# Patient Record
Sex: Male | Born: 1960 | Race: White | Hispanic: No | Marital: Married | Smoking: Former smoker
Health system: Southern US, Community
[De-identification: ages and names within clinical notes are randomized; demographics above are authoritative.]

## PROBLEM LIST (undated history)

## (undated) DIAGNOSIS — E785 Hyperlipidemia, unspecified: Secondary | ICD-10-CM

## (undated) DIAGNOSIS — H35372 Puckering of macula, left eye: Secondary | ICD-10-CM

## (undated) DIAGNOSIS — R569 Unspecified convulsions: Secondary | ICD-10-CM

## (undated) HISTORY — DX: Hyperlipidemia, unspecified: E78.5

## (undated) HISTORY — DX: Puckering of macula, left eye: H35.372

---
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
## 2008-12-07 HISTORY — PX: VASECTOMY: SHX75

## 2010-08-04 ENCOUNTER — Encounter: Payer: Self-pay | Admitting: Family Medicine

## 2010-08-04 ENCOUNTER — Ambulatory Visit: Payer: Self-pay | Admitting: Internal Medicine

## 2010-08-04 DIAGNOSIS — E663 Overweight: Secondary | ICD-10-CM

## 2010-08-04 LAB — CONVERTED CEMR LAB
CO2: 26 meq/L (ref 19–32)
Chloride: 105 meq/L (ref 96–112)
Glucose, Bld: 81 mg/dL (ref 70–99)
Potassium: 4.6 meq/L (ref 3.5–5.1)
Sodium: 141 meq/L (ref 135–145)

## 2011-01-06 NOTE — Letter (Signed)
Summary: Lipid Letter  [place] [facility]  [address] [place], [place] [address]   Phone: [phone]  Fax: [phone]    08/04/2010  Kyle Velasquez [address] [place], [place]  [address]  Dear Kyle Velasquez:  We have carefully reviewed your last lipid profile from  and the results are noted below with a summary of recommendations for lipid management.    Cholesterol:       213     Goal: < 200   HDL "good" Cholesterol:   57.84     Goal: > 40   LDL "bad" Cholesterol:   135     Goal: < 130   Triglycerides:       230.0     Goal: < 150    Your good cholesterol was a bit low.   Your bad cholesterol was a bit high.  your triglycerides were also elevated more than I'd like them to be.  For this, try to stay away from fatty greasy foods, increase fiber in diet, and increase exercise regimen.  All borderline.  I wouldn't recommend starting medication, but try some overall healthy lifestyle changes.    TLC Diet (Therapeutic Lifestyle Change): Saturated Fats & Transfatty acids should be kept < 7% of total calories ***Reduce Saturated Fats Polyunstaurated Fat can be up to 10% of total calories Monounsaturated Fat Fat can be up to 20% of total calories Total Fat should be no greater than 25-35% of total calories Carbohydrates should be 50-60% of total calories Protein should be approximately 15% of total calories Fiber should be at least 20-30 grams a day ***Increased fiber may help lower LDL Total Cholesterol should be < 200mg /day Consider adding plant stanol/sterols to diet (example: Benacol spread) ***A higher intake of unsaturated fat may reduce Triglycerides and Increase HDL    Adjunctive Measures (may lower LIPIDS and reduce risk of Heart Attack) include: Aerobic Exercise (20-30 minutes 3-4 times a week) Limit Alcohol Consumption Weight Reduction Aspirin 75-81 mg a day by mouth (if not allergic or contraindicated) Dietary Fiber 20-30 grams a day by mouth     Current  Medications: 1)    Claritin-d 24 Hour 10-240 Mg Xr24h-tab (Loratadine-pseudoephedrine) .... As needed allergies  If you have any questions, please call. We appreciate being able to work with you.   Sincerely,    [place] [facility] Eustaquio Boyden  MD  Appended Document: Lipid Letter Letter mailed to patient

## 2011-01-08 NOTE — Assessment & Plan Note (Signed)
Summary: NEW PT TO ESTABH/DLO   Vital Signs:  Patient profile:   50 year old male Height:      66.5 inches Weight:      180.25 pounds BMI:     28.76 Temp:     98.1 degrees F oral Pulse rate:   64 / minute Pulse rhythm:   regular BP sitting:   122 / 84  (left arm) Cuff size:   regular  Vitals Entered By: Selena Batten Dance CMA Duncan Dull) (August 04, 2010 9:16 AM) CC: New patient to establish care   History of Present Illness: CC: new patient.  no concerns, would like physical today.   started basketball 2 wks ago, minor knee and shoulder aches/pains.  Preventive Screening-Counseling & Management  Alcohol-Tobacco     Alcohol drinks/day: <1     Smoking Status: quit > 6 months  Caffeine-Diet-Exercise     Caffeine use/day: 6-8 cups coffee      Drug Use:  never.        Blood Transfusions:  no.    Current Medications (verified): 1)  None  Allergies (verified): No Known Drug Allergies  Past History:  Past Medical History: slightly overweight  Past Surgical History: vasectomy 2010 (Dr. Mordecai Rasmussen [facility]))  Family History: Mother - HTN, HLD Father - hypoglycemia MGF - DM MGM - CHF PGF, GM - DM sister - BRCA  No CAD/MI, CVA, CA  Social History: remote smoking, social EtOH, no rec drugs Occupation: Chemical engineer Lives with wife, 2 daughters, 1 dog. Exercise - basketball weekly, walking 2 miles a daySmoking Status:  quit > 6 months Caffeine use/day:  6-8 cups coffee Drug Use:  never Blood Transfusions:  no  Review of Systems  The patient denies anorexia, fever, weight loss, weight gain, vision loss, decreased hearing, hoarseness, chest pain, syncope, dyspnea on exertion, peripheral edema, prolonged cough, headaches, hemoptysis, abdominal pain, melena, hematochezia, severe indigestion/heartburn, hematuria, incontinence, genital sores, muscle weakness, suspicious skin lesions, transient blindness, difficulty walking, depression, unusual weight change, and  testicular masses.         no n/v/d/c.  all else negative.  Physical Exam  General:  Well-developed,well-nourished,in no acute distress; alert,appropriate and cooperative throughout examination Head:  Normocephalic and atraumatic without obvious abnormalities. No apparent alopecia or balding. Eyes:  No corneal or conjunctival inflammation noted. EOMI. Perrla.  Ears:  External ear exam shows no significant lesions or deformities.  Otoscopic examination reveals clear canals, tympanic membranes are intact bilaterally without bulging, retraction, inflammation or discharge. Hearing is grossly normal bilaterally. Nose:  External nasal examination shows no deformity or inflammation. Nasal mucosa are pink and moist without lesions or exudates.  R turbinate swollen Mouth:  Oral mucosa and oropharynx without lesions or exudates.  Teeth in good repair. Neck:  No deformities, masses, or tenderness noted.  no thyromegaly Lungs:  Normal respiratory effort, chest expands symmetrically. Lungs are clear to auscultation, no crackles or wheezes. Heart:  Normal rate and regular rhythm. S1 and S2 normal without gallop, murmur, click, rub or other extra sounds. Abdomen:  Bowel sounds positive,abdomen soft and non-tender without masses, organomegaly or hernias noted. Msk:  No deformity or scoliosis noted of thoracic or lumbar spine.   Pulses:  2+ rad pulses Extremities:  No clubbing, cyanosis, edema, or deformity noted with normal full range of motion of all joints.   Neurologic:  CN grossly intact, station and gait intact Skin:  Intact without suspicious lesions or rashes Psych:  Cognition and judgment appear intact. Alert and  cooperative with normal attention span and concentration. No apparent delusions, illusions, hallucinations   Impression & Recommendations:  Problem # 1:  OVERWEIGHT (ICD-278.02)  basic blood work.  Ht: 66.5 (08/04/2010)   Wt: 180.25 (08/04/2010)   BMI: 28.76  (08/04/2010)  Orders: TLB-Lipid Panel (80061-LIPID) TLB-BMP (Basic Metabolic Panel-BMET) (80048-METABOL)  Problem # 2:  HEALTH MAINTENANCE EXAM (ICD-V70.0)  Reviewed preventive care protocols, scheduled due services, and updated immunizations.  tdap today.  declines flu, will receive at work.  start PSA and colonscreen next year.  Complete Medication List: 1)  Claritin-d 24 Hour 10-240 Mg Xr24h-tab (Loratadine-pseudoephedrine) .... As needed allergies  Other Orders: Tdap => 58yrs IM (42706) Admin 1st Vaccine (23762)  Patient Instructions: 1)  Please return in 1 year for CPE or as needed. 2)  Tdap today. 3)  Blood work today.   4)  Pleasure to meet you today, call clinic with questions.  Prior Medications: Current Allergies (reviewed today): No known allergies    Prevention & Chronic Care Immunizations   Influenza vaccine: Not documented   Influenza vaccine due: 08/07/2010    Tetanus booster: 08/04/2010: Tdap   Tetanus booster due: 08/04/2020    Pneumococcal vaccine: Not documented  Other Screening   Smoking status: quit > 6 months  (08/04/2010)  Lipids   Total Cholesterol: Not documented   LDL: Not documented   LDL Direct: Not documented   HDL: Not documented   Triglycerides: Not documented   Immunizations Administered:  Tetanus Vaccine:    Vaccine Type: Tdap    Site: left deltoid    Mfr: GlaxoSmithKline    Dose: 0.5 ml    Route: IM    Given by: Selena Batten Dance CMA (AAMA)    Exp. Date: 09/25/2012    Lot #: [id number]    VIS given: 10/25/07 version given August 04, 2010.   Appended Document: NEW PT TO ESTABH/DLO Received records from Dr. Candace Gallus, no records available, inactive, not seen since 2005

## 2012-06-22 ENCOUNTER — Ambulatory Visit (INDEPENDENT_AMBULATORY_CARE_PROVIDER_SITE_OTHER): Payer: Managed Care, Other (non HMO) | Admitting: Family Medicine

## 2012-06-22 ENCOUNTER — Encounter: Payer: Self-pay | Admitting: Family Medicine

## 2012-06-22 ENCOUNTER — Encounter: Payer: Self-pay | Admitting: Internal Medicine

## 2012-06-22 VITALS — BP 118/82 | HR 60 | Temp 97.6°F | Ht 64.0 in | Wt 178.5 lb

## 2012-06-22 DIAGNOSIS — Z0000 Encounter for general adult medical examination without abnormal findings: Secondary | ICD-10-CM

## 2012-06-22 DIAGNOSIS — Z125 Encounter for screening for malignant neoplasm of prostate: Secondary | ICD-10-CM

## 2012-06-22 DIAGNOSIS — Z1211 Encounter for screening for malignant neoplasm of colon: Secondary | ICD-10-CM

## 2012-06-22 DIAGNOSIS — E785 Hyperlipidemia, unspecified: Secondary | ICD-10-CM

## 2012-06-22 HISTORY — DX: Hyperlipidemia, unspecified: E78.5

## 2012-06-22 LAB — LIPID PANEL
HDL: 41.8 mg/dL (ref >39.00)
Total CHOL/HDL Ratio: 6
Triglycerides: 189 mg/dL — ABNORMAL HIGH (ref 0.0–149.0)

## 2012-06-22 LAB — COMPREHENSIVE METABOLIC PANEL
ALT: 23 U/L (ref 0–53)
Alkaline Phosphatase: 56 U/L (ref 39–117)
Glucose, Bld: 99 mg/dL (ref 70–99)
Sodium: 139 mEq/L (ref 135–145)
Total Bilirubin: 0.6 mg/dL (ref 0.3–1.2)
Total Protein: 7 g/dL (ref 6.0–8.3)

## 2012-06-22 LAB — TSH: TSH: 1.71 u[IU]/mL (ref 0.35–5.50)

## 2012-06-22 NOTE — Patient Instructions (Signed)
Decrease added sugars, eliminate trans fats, increase fiber and limit alcohol.  All these changes together can drop triglycerides by almost 50%.  Pass by Marion's office up front to schedule colonoscopy around [place]. Return in 1 year for physical. Good to see you today.

## 2012-06-22 NOTE — Assessment & Plan Note (Signed)
Discussed healthy diet 

## 2012-06-22 NOTE — Assessment & Plan Note (Signed)
Body mass index is 30.64 kg/(m^2).  Preventative protocols reviewed and updated unless pt declined Discussed healthy diet, lifestyle

## 2012-06-22 NOTE — Progress Notes (Signed)
Subjective:    Patient ID: Kyle Velasquez, male    DOB: [birth year], 51 y.o.   MRN: [id number]  HPI CC: CPE  No concerns today.  Some stress at work.  Preventative: Colon screening - did notice blood in stool last week, in stool.  Would like colonoscopy Prostate screening - discussed, would like to get screened. Tetanus 2011 (Tdap) Seat belt use discussed 100% Doesn't use sunscreen  Caffeine use/day:  2-3 cups coffee/day Lives with wife, 2 daughters, 1 dog. Occupation: Chemical engineer Exercise - basketball weekly, walking 2 miles a day Diet: good water, fruits/vegetables occasionally  Medications and allergies reviewed and updated in chart.  Past histories reviewed and updated if relevant as below. Patient Active Problem List  Diagnosis  . OVERWEIGHT   No past medical history on file. Past Surgical History  Procedure Date  . Vasectomy 2010    Dr. Mordecai Rasmussen [facility])   History  Substance Use Topics  . Smoking status: Former Smoker    Quit date: 12/07/1986  . Smokeless tobacco: Never Used  . Alcohol Use: Yes     Occasional   Family History  Problem Relation Age of Onset  . Hypertension Mother   . Hyperlipidemia Mother   . Other Father     Hypoglycemia  . Diabetes Maternal Grandfather   . Heart failure Maternal Grandmother   . Diabetes Paternal Grandfather   . Diabetes Paternal Grandmother   . Cancer Sister     breast  . Cancer Maternal Aunt     breast  . Coronary artery disease Neg Hx   . Stroke Neg Hx    No Known Allergies No current outpatient prescriptions on file prior to visit.    Review of Systems  Constitutional: Negative for fever, chills, activity change, appetite change, fatigue and unexpected weight change.  HENT: Negative for hearing loss and neck pain.   Eyes: Negative for visual disturbance.  Respiratory: Negative for cough, chest tightness, shortness of breath and wheezing.   Cardiovascular: Negative for chest pain, palpitations and  leg swelling.  Gastrointestinal: Positive for blood in stool (last week). Negative for nausea, vomiting, abdominal pain, diarrhea, constipation and abdominal distention.  Genitourinary: Negative for hematuria and difficulty urinating.  Musculoskeletal: Negative for myalgias and arthralgias.  Skin: Negative for rash.  Neurological: Negative for dizziness, seizures, syncope and headaches.  Hematological: Does not bruise/bleed easily.  Psychiatric/Behavioral: Negative for dysphoric mood. The patient is not nervous/anxious.        Objective:   Physical Exam  Nursing note and vitals reviewed. Constitutional: He is oriented to person, place, and time. He appears well-developed and well-nourished. No distress.  HENT:  Head: Normocephalic and atraumatic.  Right Ear: External ear normal.  Left Ear: External ear normal.  Nose: Nose normal.  Mouth/Throat: Oropharynx is clear and moist. No oropharyngeal exudate.  Eyes: Conjunctivae and EOM are normal. Pupils are equal, round, and reactive to light. No scleral icterus.  Neck: Normal range of motion. Neck supple. No thyromegaly present.  Cardiovascular: Normal rate, regular rhythm, normal heart sounds and intact distal pulses.   No murmur heard. Pulses:      Radial pulses are 2+ on the right side, and 2+ on the left side.  Pulmonary/Chest: Effort normal and breath sounds normal. No respiratory distress. He has no wheezes. He has no rales.  Abdominal: Soft. Bowel sounds are normal. He exhibits no distension and no mass. There is no tenderness. There is no rebound and no guarding.  Genitourinary: Prostate normal.  Rectal exam shows external hemorrhoid (right sided, swollen, nontender, noniflammed). Rectal exam shows no internal hemorrhoid, no fissure, no mass, no tenderness and anal tone normal. Prostate is not enlarged and not tender.  Musculoskeletal: Normal range of motion. He exhibits no edema.  Lymphadenopathy:    He has no cervical adenopathy.    Neurological: He is alert and oriented to person, place, and time.       CN grossly intact, station and gait intact  Skin: Skin is warm and dry. No rash noted.  Psychiatric: He has a normal mood and affect. His behavior is normal. Judgment and thought content normal.       Assessment & Plan:

## 2012-08-07 HISTORY — PX: COLONOSCOPY: SHX174

## 2012-08-12 ENCOUNTER — Ambulatory Visit (AMBULATORY_SURGERY_CENTER): Payer: Managed Care, Other (non HMO) | Admitting: *Deleted

## 2012-08-12 VITALS — Ht 64.0 in | Wt 180.0 lb

## 2012-08-12 DIAGNOSIS — Z1211 Encounter for screening for malignant neoplasm of colon: Secondary | ICD-10-CM

## 2012-08-12 MED ORDER — NA SULFATE-K SULFATE-MG SULF 17.5-3.13-1.6 GM/177ML PO SOLN: 1.0000 | Freq: Once | ORAL | Status: DC

## 2012-08-26 ENCOUNTER — Encounter: Payer: Self-pay | Admitting: Internal Medicine

## 2012-08-26 ENCOUNTER — Ambulatory Visit (AMBULATORY_SURGERY_CENTER): Payer: Managed Care, Other (non HMO) | Admitting: Internal Medicine

## 2012-08-26 VITALS — BP 94/46 | HR 80 | Temp 97.3°F | Resp 15 | Ht 64.0 in | Wt 180.0 lb

## 2012-08-26 DIAGNOSIS — Z1211 Encounter for screening for malignant neoplasm of colon: Secondary | ICD-10-CM

## 2012-08-26 MED ORDER — SODIUM CHLORIDE 0.9 % IV SOLN: 500.0000 mL | INTRAVENOUS | Status: DC

## 2012-08-26 NOTE — Op Note (Signed)
[facility] [address] [place], [address]   COLONOSCOPY PROCEDURE REPORT  PATIENT: Kyle Velasquez, Kyle Velasquez  MR#: [id number] BIRTHDATE: Apr 06, 1961 , 51  yrs. old GENDER: Male ENDOSCOPIST: Beverley Fiedler, MD REFERRED [id number], Wynona Canes PROCEDURE DATE:  08/26/2012 PROCEDURE:   Colonoscopy, screening ASA CLASS:   Class I INDICATIONS:average risk screening. MEDICATIONS: MAC sedation, administered by CRNA and Propofol (Diprivan) 260 mg IV  DESCRIPTION OF PROCEDURE:   After the risks benefits and alternatives of the procedure were thoroughly explained, informed consent was obtained.  A digital rectal exam revealed several skin tags.   The LB [id number]  endoscope was introduced through the anus and advanced to the cecum, which was identified by both the appendix and ileocecal valve. No adverse events experienced. The quality of the prep was Suprep good  The instrument was then slowly withdrawn as the colon was fully examined.      COLON FINDINGS: A normal appearing cecum, ileocecal valve, and appendiceal orifice were identified.  The ascending, hepatic flexure, transverse, splenic flexure, descending, sigmoid colon and rectum appeared unremarkable.  No polyps or cancers were seen. Small internal hemorrhoids were found.  Retroflexed views revealed internal hemorrhoids. The time to cecum=6 minutes 51 seconds. Withdrawal time=9 minutes 30 seconds.  The scope was withdrawn and the procedure completed.  COMPLICATIONS: There were no complications.  ENDOSCOPIC IMPRESSION: 1.   Normal colon 2.   Small internal hemorrhoids  RECOMMENDATIONS: You should continue to follow colorectal cancer screening guidelines for "routine risk" patients with a repeat colonoscopy in 10 years. There is no need for FOBT (stool) testing for at least 5 years.   eSigned:  Beverley Fiedler, MD 08/26/2012 8:54 AM   cc: Eustaquio Boyden MD and The Patient

## 2012-08-26 NOTE — Patient Instructions (Addendum)

## 2012-08-26 NOTE — Progress Notes (Addendum)
Patient did not have preoperative order for IV antibiotic SSI prophylaxis. (G8918)  Patient did not experience any of the following events: a burn prior to discharge; a fall within the facility; wrong site/side/patient/procedure/implant event; or a hospital transfer or hospital admission upon discharge from the facility. (G8907)  

## 2012-08-29 ENCOUNTER — Telehealth: Payer: Self-pay

## 2012-08-29 NOTE — Telephone Encounter (Signed)
  Follow up Call-  Call back number 08/26/2012  Post procedure Call Back phone  # [phone]  Permission to leave phone message Yes     Patient questions:  Do you have a fever, pain , or abdominal swelling? no Pain Score  0 *  Have you tolerated food without any problems? yes  Have you been able to return to your normal activities? yes  Do you have any questions about your discharge instructions: Diet   no Medications  no Follow up visit  no  Do you have questions or concerns about your Care? no  Actions: * If pain score is 4 or above: No action needed, pain <4.

## 2012-08-31 ENCOUNTER — Encounter: Payer: Self-pay | Admitting: Family Medicine

## 2013-09-13 ENCOUNTER — Encounter: Payer: Self-pay | Admitting: Family Medicine

## 2013-09-13 ENCOUNTER — Ambulatory Visit (INDEPENDENT_AMBULATORY_CARE_PROVIDER_SITE_OTHER): Payer: Managed Care, Other (non HMO) | Admitting: Family Medicine

## 2013-09-13 VITALS — BP 114/88 | HR 60 | Temp 98.0°F | Ht 65.32 in | Wt 184.0 lb

## 2013-09-13 DIAGNOSIS — E663 Overweight: Secondary | ICD-10-CM

## 2013-09-13 DIAGNOSIS — Z125 Encounter for screening for malignant neoplasm of prostate: Secondary | ICD-10-CM

## 2013-09-13 DIAGNOSIS — E785 Hyperlipidemia, unspecified: Secondary | ICD-10-CM

## 2013-09-13 DIAGNOSIS — K644 Residual hemorrhoidal skin tags: Secondary | ICD-10-CM

## 2013-09-13 DIAGNOSIS — K649 Unspecified hemorrhoids: Secondary | ICD-10-CM | POA: Insufficient documentation

## 2013-09-13 DIAGNOSIS — Z0000 Encounter for general adult medical examination without abnormal findings: Secondary | ICD-10-CM

## 2013-09-13 LAB — LIPID PANEL
HDL: 44.2 mg/dL (ref >39.00)
Total CHOL/HDL Ratio: 6
Triglycerides: 245 mg/dL — ABNORMAL HIGH (ref 0.0–149.0)

## 2013-09-13 LAB — BASIC METABOLIC PANEL
Calcium: 9.3 mg/dL (ref 8.4–10.5)
GFR: 64.43 mL/min (ref >60.00)
Potassium: 4.6 mEq/L (ref 3.5–5.1)
Sodium: 142 mEq/L (ref 135–145)

## 2013-09-13 NOTE — Assessment & Plan Note (Signed)
Body mass index is 30.32 kg/(m^2). Encouraged continued walking.

## 2013-09-13 NOTE — Assessment & Plan Note (Signed)
Not bothersome.  Declines further intervention currently.

## 2013-09-13 NOTE — Patient Instructions (Signed)
Blood work today. Continue walking.  let's watch weight. Return in 1 year for next physical.

## 2013-09-13 NOTE — Assessment & Plan Note (Signed)
Check FLP today. Discussed healthy diet to improve lipids.

## 2013-09-13 NOTE — Progress Notes (Signed)
Subjective:    Patient ID: Kyle Velasquez, male    DOB: [birth year], 52 y.o.   MRN: [id number]  HPI CC: CPE  claritin d use intermittently. Recent vision exam stable.  No changes recently. Wt Readings from Last 3 Encounters:  09/13/13 184 lb (83.462 kg)  08/26/12 180 lb (81.647 kg)  08/12/12 180 lb (81.647 kg)  Body mass index is 30.32 kg/(m^2).   Preventative:  Colon screening - done by Dr. Rhea Belton 2013.  WNL Prostate screening - discussed, would like to get screened.  Tetanus 2011 To get flu shot at work next week  Seat belt use discussed 100%  Doesn't use sunscreen.  No sunburns or suspicious moles.  Caffeine use/day: 2-3 cups coffee/day  Lives with wife, 2 daughters, 1 dog.  Occupation: Chemical engineer  Exercise - basketball weekly, walking 2 miles a day  Diet: some water, fruits/vegetables occasionally, red meat 1x/wk, fish rarely  Medications and allergies reviewed and updated in chart.  Past histories reviewed and updated if relevant as below. Patient Active Problem List   Diagnosis Date Noted  . Healthcare maintenance 06/22/2012  . Dyslipidemia 06/22/2012  . OVERWEIGHT 08/04/2010   Past Medical History  Diagnosis Date  . Dyslipidemia 06/22/2012   Past Surgical History  Procedure Laterality Date  . Vasectomy  2010    Dr. Mordecai Rasmussen [facility])  . Colonoscopy  08/2012    WNL, sm int hemorrhoids, rec rpt 10 yrs (Pyrtle)   History  Substance Use Topics  . Smoking status: Former Smoker    Quit date: 12/07/1986  . Smokeless tobacco: Never Used  . Alcohol Use: Yes     Comment: Occasional   Family History  Problem Relation Age of Onset  . Hypertension Mother   . Hyperlipidemia Mother   . Other Father     Hypoglycemia  . Diabetes Maternal Grandfather   . Heart failure Maternal Grandmother   . Diabetes Paternal Grandfather   . Diabetes Paternal Grandmother   . Cancer Sister     breast  . Cancer Maternal Aunt     breast  . Coronary artery disease Neg  Hx   . Stroke Neg Hx   . Colon cancer Neg Hx   . Stomach cancer Neg Hx    No Known Allergies No current outpatient prescriptions on file prior to visit.   No current facility-administered medications on file prior to visit.     Review of Systems  Constitutional: Negative for fever, chills, activity change, appetite change, fatigue and unexpected weight change.  HENT: Negative for hearing loss.   Eyes: Negative for visual disturbance.  Respiratory: Negative for cough, chest tightness, shortness of breath and wheezing.   Cardiovascular: Negative for chest pain, palpitations and leg swelling.  Gastrointestinal: Negative for nausea, vomiting, abdominal pain, diarrhea, constipation, blood in stool and abdominal distention.  Genitourinary: Negative for hematuria and difficulty urinating.  Musculoskeletal: Negative for arthralgias, myalgias and neck pain.  Skin: Negative for rash.  Neurological: Negative for dizziness, seizures, syncope and headaches.  Hematological: Negative for adenopathy. Does not bruise/bleed easily.  Psychiatric/Behavioral: Negative for dysphoric mood. The patient is not nervous/anxious.        Objective:   Physical Exam  Nursing note and vitals reviewed. Constitutional: He is oriented to person, place, and time. He appears well-developed and well-nourished. No distress.  HENT:  Head: Normocephalic and atraumatic.  Right Ear: Hearing, tympanic membrane, external ear and ear canal normal.  Left Ear: Hearing, tympanic membrane, external ear and ear canal  normal.  Nose: Nose normal.  Mouth/Throat: Oropharynx is clear and moist. No oropharyngeal exudate.  Eyes: Conjunctivae and EOM are normal. Pupils are equal, round, and reactive to light. No scleral icterus.  Neck: Normal range of motion. Neck supple. No thyromegaly present.  Cardiovascular: Normal rate, regular rhythm, normal heart sounds and intact distal pulses.   No murmur heard. Pulses:      Radial pulses  are 2+ on the right side, and 2+ on the left side.  Pulmonary/Chest: Effort normal and breath sounds normal. No respiratory distress. He has no wheezes. He has no rales.  Abdominal: Soft. Bowel sounds are normal. He exhibits no distension and no mass. There is no tenderness. There is no rebound and no guarding.  Genitourinary: Prostate normal. Rectal exam shows external hemorrhoid (posterior and right sided present). Rectal exam shows no internal hemorrhoid, no fissure, no mass, no tenderness and anal tone normal. Prostate is not enlarged and not tender.  Musculoskeletal: Normal range of motion. He exhibits no edema.  Lymphadenopathy:    He has no cervical adenopathy.  Neurological: He is alert and oriented to person, place, and time.  CN grossly intact, station and gait intact  Skin: Skin is warm and dry. No rash noted.  Psychiatric: He has a normal mood and affect. His behavior is normal. Judgment and thought content normal.       Assessment & Plan:

## 2013-09-13 NOTE — Assessment & Plan Note (Addendum)
Preventative protocols reviewed and updated unless pt declined. Discussed healthy diet and lifestyle. Flu shot at work. 

## 2013-09-14 LAB — LDL CHOLESTEROL, DIRECT: Direct LDL: 178.6 mg/dL

## 2013-09-15 ENCOUNTER — Encounter: Payer: Self-pay | Admitting: *Deleted

## 2014-03-30 ENCOUNTER — Encounter: Payer: Self-pay | Admitting: Internal Medicine

## 2014-03-30 ENCOUNTER — Ambulatory Visit (INDEPENDENT_AMBULATORY_CARE_PROVIDER_SITE_OTHER): Payer: Managed Care, Other (non HMO) | Admitting: Internal Medicine

## 2014-03-30 VITALS — BP 110/70 | HR 80 | Temp 98.0°F | Wt 185.2 lb

## 2014-03-30 DIAGNOSIS — J309 Allergic rhinitis, unspecified: Secondary | ICD-10-CM

## 2014-03-30 NOTE — Patient Instructions (Addendum)

## 2014-03-30 NOTE — Progress Notes (Signed)
Pre visit review using our clinic review tool, if applicable. No additional management support is needed unless otherwise documented below in the visit note. 

## 2014-03-30 NOTE — Progress Notes (Signed)
HPI  Pt presents to the clinic today with c/o cough and chest congestions. He reports this started 2 days ago. The cough is non productive. He denies fever, chills or body aches. He has tried Claritin D and Mucinex OTC. He has no history of allergies or breathing problems. He has had sick contacts.  Review of Systems      Past Medical History  Diagnosis Date  . Dyslipidemia 06/22/2012    Family History  Problem Relation Age of Onset  . Hypertension Mother   . Hyperlipidemia Mother   . Other Father     Hypoglycemia  . Diabetes Maternal Grandfather   . Heart failure Maternal Grandmother   . Diabetes Paternal Grandfather   . Diabetes Paternal Grandmother   . Cancer Sister     breast  . Cancer Maternal Aunt     breast  . Coronary artery disease Neg Hx   . Stroke Neg Hx   . Colon cancer Neg Hx   . Stomach cancer Neg Hx     History   Social History  . Marital Status: Married    Spouse Name: N/A    Number of Children: 2  . Years of Education: N/A   Occupational History  . Operations manager    Social History Main Topics  . Smoking status: Former Smoker    Quit date: 12/07/1986  . Smokeless tobacco: Never Used  . Alcohol Use: Yes     Comment: Occasional  . Drug Use: No  . Sexual Activity: Not on file   Other Topics Concern  . Not on file   Social History Narrative   Caffeine use/day: 2-3 cups coffee/day    Lives with wife, 2 daughters, 1 dog.    Occupation: Network engineer    Exercise - basketball weekly, walking 2 miles a day    Diet: some water, fruits/vegetables occasionally, red meat 1x/wk, fish rarely    No Known Allergies   Constitutional: Denies headache, fatigue, fever or abrupt weight changes.  HEENT:   Denies eye redness, eye pain, pressure behind the eyes, facial pain, nasal congestion, ear pain, ringing in the ears, wax buildup, runny nose or bloody nose. Respiratory: Positive cough. Denies difficulty breathing or shortness of breath.   Cardiovascular: Denies chest pain, chest tightness, palpitations or swelling in the hands or feet.   No other specific complaints in a complete review of systems (except as listed in HPI above).  Objective:   Wt 185 lb 4 oz (84.029 kg) Wt Readings from Last 3 Encounters:  03/30/14 185 lb 4 oz (84.029 kg)  09/13/13 184 lb (83.462 kg)  08/26/12 180 lb (81.647 kg)     General: Appears his stated age, well developed, well nourished in NAD. HEENT: Head: normal shape and size; Eyes: sclera white, no icterus, conjunctiva pink, PERRLA and EOMs intact; Ears: Tm's gray and intact, normal light reflex; Nose: mucosa pink and moist, septum midline; Throat/Mouth: + PND. Teeth present, mucosa erythematous and moist, no exudate noted, no lesions or ulcerations noted.  Neck: Neck supple, trachea midline. No massses, lumps or thyromegaly present.  Cardiovascular: Normal rate and rhythm. S1,S2 noted.  No murmur, rubs or gallops noted. No JVD or BLE edema. No carotid bruits noted. Pulmonary/Chest: Normal effort and positive vesicular breath sounds. No respiratory distress. No wheezes, rales or ronchi noted.      Assessment & Plan:   Allergic Rhinitis:  Get some rest and drink plenty of water Do salt water gargles for the  sore throat Delsym OTC for cough Continue mucinex and Claritin D  RTC as needed or if symptoms persist.

## 2015-01-23 ENCOUNTER — Ambulatory Visit (INDEPENDENT_AMBULATORY_CARE_PROVIDER_SITE_OTHER): Payer: Managed Care, Other (non HMO) | Admitting: Family Medicine

## 2015-01-23 ENCOUNTER — Encounter: Payer: Self-pay | Admitting: Family Medicine

## 2015-01-23 ENCOUNTER — Encounter: Payer: Self-pay | Admitting: *Deleted

## 2015-01-23 ENCOUNTER — Other Ambulatory Visit: Payer: Self-pay | Admitting: Family Medicine

## 2015-01-23 VITALS — BP 116/84 | HR 68 | Temp 97.9°F | Ht 66.5 in | Wt 197.5 lb

## 2015-01-23 DIAGNOSIS — Z125 Encounter for screening for malignant neoplasm of prostate: Secondary | ICD-10-CM

## 2015-01-23 DIAGNOSIS — K649 Unspecified hemorrhoids: Secondary | ICD-10-CM

## 2015-01-23 DIAGNOSIS — Z0000 Encounter for general adult medical examination without abnormal findings: Secondary | ICD-10-CM

## 2015-01-23 DIAGNOSIS — E669 Obesity, unspecified: Secondary | ICD-10-CM

## 2015-01-23 DIAGNOSIS — E785 Hyperlipidemia, unspecified: Secondary | ICD-10-CM

## 2015-01-23 LAB — COMPREHENSIVE METABOLIC PANEL
ALBUMIN: 4.3 g/dL (ref 3.5–5.2)
ALK PHOS: 74 U/L (ref 39–117)
ALT: 48 U/L (ref 0–53)
AST: 23 U/L (ref 0–37)
BUN: 27 mg/dL — AB (ref 6–23)
CALCIUM: 9.6 mg/dL (ref 8.4–10.5)
CHLORIDE: 106 meq/L (ref 96–112)
CO2: 25 mEq/L (ref 19–32)
Creatinine, Ser: 1.21 mg/dL (ref 0.40–1.50)
GFR: 66.55 mL/min (ref >60.00)
GLUCOSE: 89 mg/dL (ref 70–99)
Potassium: 4.4 mEq/L (ref 3.5–5.1)
Sodium: 139 mEq/L (ref 135–145)
TOTAL PROTEIN: 7.2 g/dL (ref 6.0–8.3)
Total Bilirubin: 0.4 mg/dL (ref 0.2–1.2)

## 2015-01-23 LAB — PSA: PSA: 0.57 ng/mL (ref 0.10–4.00)

## 2015-01-23 LAB — LIPID PANEL
CHOLESTEROL: 296 mg/dL — AB (ref 0–200)
HDL: 34.2 mg/dL — ABNORMAL LOW (ref >39.00)
TRIGLYCERIDES: 531 mg/dL — AB (ref 0.0–149.0)
Total CHOL/HDL Ratio: 9

## 2015-01-23 LAB — LDL CHOLESTEROL, DIRECT: LDL DIRECT: 144 mg/dL

## 2015-01-23 NOTE — Assessment & Plan Note (Addendum)
Persistent, enlarging. Interested in further treatment - will refer to gen surg clinic.

## 2015-01-23 NOTE — Assessment & Plan Note (Signed)
Check FLP today, discussed low chol diet.  Recommend mediterranean diet, educational handout provided

## 2015-01-23 NOTE — Progress Notes (Signed)
BP 116/84 mmHg  Pulse 68  Temp(Src) 97.9 F (36.6 C) (Oral)  Ht 5' 6.5" (1.689 m)  Wt 197 lb 8 oz (89.585 kg)  BMI 31.40 kg/m2   CC: CPE  Subjective:    Patient ID: Kyle Velasquez, male    DOB: 1961-07-30, 54 y.o.   MRN: [id number]  HPI: Kyle Velasquez is a 54 y.o. male presenting on 01/23/2015 for Annual Exam   Chronic hemorrhoid present for several years - no pain or bleeding. asxs but wants definitive treatment for this. Enlarging.   Preventative: Colon screening - done by Dr. Hilarie Fredrickson 2013. WNL Prostate screening - would like yearly screen Tetanus 2011 Flu shot - 10/2014 Seat belt use discussed 100% No sunburns or suspicious moles.  Caffeine use/day: 2-3 cups coffee/day  Lives with wife, 2 daughters, 1 dog.  Occupation: Network engineer  Exercise - basketball weekly, stopped walking 2 miles a day Diet: some water, fruits/vegetables occasionally, red meat 1x/wk, fish rarely  Relevant past medical, surgical, family and social history reviewed and updated as indicated. Interim medical history since our last visit reviewed. Allergies and medications reviewed and updated. No current outpatient prescriptions on file prior to visit.   No current facility-administered medications on file prior to visit.    Review of Systems  Constitutional: Negative for fever, chills, activity change, appetite change, fatigue and unexpected weight change.  HENT: Negative for hearing loss.   Eyes: Negative for visual disturbance.  Respiratory: Negative for cough, chest tightness, shortness of breath and wheezing.   Cardiovascular: Positive for chest pain (sharp intermittently). Negative for palpitations and leg swelling.  Gastrointestinal: Negative for nausea, vomiting, abdominal pain, diarrhea, constipation, blood in stool and abdominal distention.  Genitourinary: Negative for hematuria and difficulty urinating.  Musculoskeletal: Negative for myalgias, arthralgias and neck  pain.  Skin: Negative for rash.  Neurological: Negative for dizziness, seizures, syncope and headaches.  Hematological: Negative for adenopathy. Does not bruise/bleed easily.  Psychiatric/Behavioral: Negative for dysphoric mood. The patient is not nervous/anxious.    Per HPI unless specifically indicated above     Objective:    BP 116/84 mmHg  Pulse 68  Temp(Src) 97.9 F (36.6 C) (Oral)  Ht 5' 6.5" (1.689 m)  Wt 197 lb 8 oz (89.585 kg)  BMI 31.40 kg/m2  Wt Readings from Last 3 Encounters:  01/23/15 197 lb 8 oz (89.585 kg)  03/30/14 185 lb 4 oz (84.029 kg)  09/13/13 184 lb (83.462 kg)    Physical Exam  Constitutional: He is oriented to person, place, and time. He appears well-developed and well-nourished. No distress.  HENT:  Head: Normocephalic and atraumatic.  Right Ear: Hearing, tympanic membrane, external ear and ear canal normal.  Left Ear: Hearing, tympanic membrane, external ear and ear canal normal.  Nose: Nose normal.  Mouth/Throat: Uvula is midline, oropharynx is clear and moist and mucous membranes are normal. No oropharyngeal exudate, posterior oropharyngeal edema or posterior oropharyngeal erythema.  Eyes: Conjunctivae and EOM are normal. Pupils are equal, round, and reactive to light. No scleral icterus.  Neck: Normal range of motion. Neck supple. No thyromegaly present.  Cardiovascular: Normal rate, regular rhythm, normal heart sounds and intact distal pulses.   No murmur heard. Pulses:      Radial pulses are 2+ on the right side, and 2+ on the left side.  Pulmonary/Chest: Effort normal and breath sounds normal. No respiratory distress. He has no wheezes. He has no rales.  Abdominal: Soft. Bowel sounds are normal. He exhibits no  distension and no mass. There is no tenderness. There is no rebound and no guarding.  Genitourinary: Prostate normal. Rectal exam shows external hemorrhoid (2 separate growths R anus). Rectal exam shows no internal hemorrhoid, no fissure,  no mass, no tenderness and anal tone normal. Prostate is not enlarged (15gm) and not tender.  Musculoskeletal: Normal range of motion. He exhibits no edema.  Lymphadenopathy:    He has no cervical adenopathy.  Neurological: He is alert and oriented to person, place, and time.  CN grossly intact, station and gait intact  Skin: Skin is warm and dry. No rash noted.  Psychiatric: He has a normal mood and affect. His behavior is normal. Judgment and thought content normal.  Nursing note and vitals reviewed.  Results for orders placed or performed in visit on 09/13/13  Lipid panel  Result Value Ref Range   Cholesterol 274 (H) 0 - 200 mg/dL   Triglycerides 245.0 (H) 0.0 - 149.0 mg/dL   HDL 44.20 >39.00 mg/dL   VLDL 49.0 (H) 0.0 - 40.0 mg/dL   Total CHOL/HDL Ratio 6   Basic metabolic panel  Result Value Ref Range   Sodium 142 135 - 145 mEq/L   Potassium 4.6 3.5 - 5.1 mEq/L   Chloride 108 96 - 112 mEq/L   CO2 24 19 - 32 mEq/L   Glucose, Bld 100 (H) 70 - 99 mg/dL   BUN 25 (H) 6 - 23 mg/dL   Creatinine, Ser 1.3 0.4 - 1.5 mg/dL   Calcium 9.3 8.4 - 10.5 mg/dL   GFR 64.43 >60.00 mL/min  PSA  Result Value Ref Range   PSA 0.45 0.10 - 4.00 ng/mL  LDL cholesterol, direct  Result Value Ref Range   Direct LDL 178.6 mg/dL      Assessment & Plan:   Problem List Items Addressed This Visit    Obese    Body mass index is 31.4 kg/(m^2). Discussed healthy diet and lifestyle changes to affect sustainable weight loss.      Hemorrhoid    Persistent, enlarging. Interested in further treatment - will refer to gen surg clinic.      Healthcare maintenance - Primary    Preventative protocols reviewed and updated unless pt declined. Discussed healthy diet and lifestyle.       Dyslipidemia    Check FLP today, discussed low chol diet.  Recommend mediterranean diet, educational handout provided      Relevant Orders   Lipid panel   Comprehensive metabolic panel    Other Visit Diagnoses     Special screening for malignant neoplasm of prostate        Relevant Orders    PSA        Follow up plan: Return in about 1 year (around 01/24/2016), or as needed, for physical.

## 2015-01-23 NOTE — Patient Instructions (Addendum)
Good to see you today, call us with questions. We will call you for referral to surgery to discuss options.  Blood work today. Return as needed or in 1 year for next physical.      Mediterranean Diet  Why follow it? Research shows. . Those who follow the Mediterranean diet have a reduced risk of heart disease  . The diet is associated with a reduced incidence of Parkinson's and Alzheimer's diseases . People following the diet may have longer life expectancies and lower rates of chronic diseases  . The Dietary Guidelines for Americans recommends the Mediterranean diet as an eating plan to promote health and prevent disease  What Is the Mediterranean Diet?  . Healthy eating plan based on typical foods and recipes of Mediterranean-style cooking . The diet is primarily a plant based diet; these foods should make up a majority of meals   Starches - Plant based foods should make up a majority of meals - They are an important sources of vitamins, minerals, energy, antioxidants, and fiber - Choose whole grains, foods high in fiber and minimally processed items  - Typical grain sources include wheat, oats, barley, corn, brown rice, bulgar, farro, millet, polenta, couscous  - Various types of beans include chickpeas, lentils, fava beans, black beans, white beans   Fruits  Veggies - Large quantities of antioxidant rich fruits & veggies; 6 or more servings  - Vegetables can be eaten raw or lightly drizzled with oil and cooked  - Vegetables common to the traditional Mediterranean Diet include: artichokes, arugula, beets, broccoli, brussel sprouts, cabbage, carrots, celery, collard greens, cucumbers, eggplant, kale, leeks, lemons, lettuce, mushrooms, okra, onions, peas, peppers, potatoes, pumpkin, radishes, rutabaga, shallots, spinach, sweet potatoes, turnips, zucchini - Fruits common to the Mediterranean Diet include: apples, apricots, avocados, cherries, clementines, dates, figs, grapefruits, grapes,  melons, nectarines, oranges, peaches, pears, pomegranates, strawberries, tangerines  Fats - Replace butter and margarine with healthy oils, such as olive oil, canola oil, and tahini  - Limit nuts to no more than a handful a day  - Nuts include walnuts, almonds, pecans, pistachios, pine nuts  - Limit or avoid candied, honey roasted or heavily salted nuts - Olives are central to the [employer] - can be eaten whole or used in a variety of dishes   Meats Protein - Limiting red meat: no more than a few times a month - When eating red meat: choose lean cuts and keep the portion to the size of deck of cards - Eggs: approx. 0 to 4 times a week  - Fish and lean poultry: at least 2 a week  - Healthy protein sources include, chicken, [place], lean beef, lamb - Increase intake of seafood such as tuna, salmon, trout, mackerel, shrimp, scallops - Avoid or limit high fat processed meats such as sausage and bacon  Dairy - Include moderate amounts of low fat dairy products  - Focus on healthy dairy such as fat free yogurt, skim milk, low or reduced fat cheese - Limit dairy products higher in fat such as whole or 2% milk, cheese, ice cream  Alcohol - Moderate amounts of red wine is ok  - No more than 5 oz daily for women (all ages) and men older than age 61  - No more than 10 oz of wine daily for men younger than 39  Other - Limit sweets and other desserts  - Use herbs and spices instead of salt to flavor foods  - Herbs and spices common  to the traditional Mediterranean Diet include: basil, bay leaves, chives, cloves, cumin, fennel, garlic, lavender, marjoram, mint, oregano, parsley, pepper, rosemary, sage, savory, sumac, tarragon, thyme   It's not just a diet, it's a lifestyle:  . The Mediterranean diet includes lifestyle factors typical of those in the region  . Foods, drinks and meals are best eaten with others and savored . Daily physical activity is important for overall good health . This could  be strenuous exercise like running and aerobics . This could also be more leisurely activities such as walking, housework, yard-work, or taking the stairs . Moderation is the key; a balanced and healthy diet accommodates most foods and drinks . Consider portion sizes and frequency of consumption of certain foods   Meal Ideas & Options:  . Breakfast:  o Whole wheat toast or whole wheat English muffins with peanut butter & hard boiled egg o Steel cut oats topped with apples & cinnamon and skim milk  o Fresh fruit: banana, strawberries, melon, berries, peaches  o Smoothies: strawberries, bananas, greek yogurt, peanut butter o Low fat greek yogurt with blueberries and granola  o Egg white omelet with spinach and mushrooms o Breakfast couscous: whole wheat couscous, apricots, skim milk, cranberries  . Sandwiches:  o Hummus and grilled vegetables (peppers, zucchini, squash) on whole wheat bread   o Grilled chicken on whole wheat pita with lettuce, tomatoes, cucumbers or tzatziki  o Tuna salad on whole wheat bread: tuna salad made with greek yogurt, olives, red peppers, capers, green onions o Garlic rosemary lamb pita: lamb sauted with garlic, rosemary, salt & pepper; add lettuce, cucumber, greek yogurt to pita - flavor with lemon juice and black pepper  . Seafood:  o Mediterranean grilled salmon, seasoned with garlic, basil, parsley, lemon juice and black pepper o Shrimp, lemon, and spinach whole-grain pasta salad made with low fat greek yogurt  o Seared scallops with lemon orzo  o Seared tuna steaks seasoned salt, pepper, coriander topped with tomato mixture of olives, tomatoes, olive oil, minced garlic, parsley, green onions and cappers  . Meats:  o Herbed greek chicken salad with kalamata olives, cucumber, feta  o Red bell peppers stuffed with spinach, bulgur, lean ground beef (or lentils) & topped with feta   o Kebabs: skewers of chicken, tomatoes, onions, zucchini, squash  o [place]  burgers: made with red onions, mint, dill, lemon juice, feta cheese topped with roasted red peppers . Vegetarian o Cucumber salad: cucumbers, artichoke hearts, celery, red onion, feta cheese, tossed in olive oil & lemon juice  o Hummus and whole grain pita points with a greek salad (lettuce, tomato, feta, olives, cucumbers, red onion) o Lentil soup with celery, carrots made with vegetable broth, garlic, salt and pepper  o Tabouli salad: parsley, bulgur, mint, scallions, cucumbers, tomato, radishes, lemon juice, olive oil, salt and pepper.

## 2015-01-23 NOTE — Assessment & Plan Note (Signed)
Preventative protocols reviewed and updated unless pt declined. Discussed healthy diet and lifestyle.  

## 2015-01-23 NOTE — Progress Notes (Signed)
Pre visit review using our clinic review tool, if applicable. No additional management support is needed unless otherwise documented below in the visit note. 

## 2015-01-23 NOTE — Assessment & Plan Note (Signed)
Body mass index is 31.4 kg/(m^2). Discussed healthy diet and lifestyle changes to affect sustainable weight loss.

## 2015-01-24 ENCOUNTER — Encounter: Payer: Self-pay | Admitting: *Deleted

## 2015-01-31 ENCOUNTER — Encounter: Payer: Self-pay | Admitting: General Surgery

## 2015-01-31 ENCOUNTER — Ambulatory Visit (INDEPENDENT_AMBULATORY_CARE_PROVIDER_SITE_OTHER): Payer: Managed Care, Other (non HMO) | Admitting: General Surgery

## 2015-01-31 VITALS — BP 124/70 | HR 68 | Resp 12 | Ht 66.0 in | Wt 197.0 lb

## 2015-01-31 DIAGNOSIS — K644 Residual hemorrhoidal skin tags: Secondary | ICD-10-CM

## 2015-01-31 NOTE — Progress Notes (Signed)
Patient ID: Kyle Velasquez, male   DOB: 12/30/1960, 54 y.o.   MRN: [id number]  Chief Complaint  Patient presents with  . Other    hemorrhoids    HPI Kyle Velasquez is a 54 y.o. male. here today for evalaution hemorrhoids. He states they have been there for about five years. No pain and they hemorrhoid area does not bleed. He states the area is hard to clean.  Bowel movements are daily. His last colonoscopy was performed in 2013. HPI  Past Medical History  Diagnosis Date  . Dyslipidemia 06/22/2012  . Hyperlipidemia     Past Surgical History  Procedure Laterality Date  . Vasectomy  2010    Dr. Purvis Kilts [facility])  . Colonoscopy  08/2012    WNL, sm int hemorrhoids, rec rpt 10 yrs ([facility])    Family History  Problem Relation Age of Onset  . Hypertension Mother   . Hyperlipidemia Mother   . Other Father     Hypoglycemia  . Diabetes Maternal Grandfather   . Heart failure Maternal Grandmother   . Diabetes Paternal Grandfather   . Diabetes Paternal Grandmother   . Cancer Sister     breast  . Cancer Maternal Aunt     breast  . Coronary artery disease Paternal Uncle     MI  . Stroke Neg Hx   . Colon cancer Neg Hx   . Stomach cancer Neg Hx   . Cancer Maternal Uncle     prostate    Social History History  Substance Use Topics  . Smoking status: Former Smoker    Quit date: 12/07/1986  . Smokeless tobacco: Never Used  . Alcohol Use: Yes     Comment: Occasional    No Known Allergies  Current Outpatient Prescriptions  Medication Sig Dispense Refill  . Coenzyme Q10 (CO Q 10) 100 MG CAPS Take 2 tablets by mouth daily.    Marland Kitchen loratadine-pseudoephedrine (CLARITIN-D 24-HOUR) 10-240 MG per 24 hr tablet Take 1 tablet by mouth daily.    . Omega-3 Fatty Acids (OMEGA 3 500 PO) Take by mouth.    . Red Yeast Rice Extract 600 MG CAPS Take 2 capsules by mouth daily.     No current facility-administered medications for this visit.    Review of Systems Review of Systems  Constitutional:  Negative.   Respiratory: Negative.   Cardiovascular: Negative.   Gastrointestinal: Negative.     Blood pressure 124/70, pulse 68, resp. rate 12, height 5\' 6"  (1.676 m), weight 197 lb (89.359 kg).  Physical Exam Physical Exam  Genitourinary: Prostate normal. Rectal exam shows no tenderness. Guaiac negative stool.     Lymphadenopathy:       Right: No inguinal adenopathy present.       Left: No inguinal adenopathy present.    Data Reviewed 08/26/2012 colonoscopy reviewed. Internal hemorrhoids reported.  Assessment    External anal skin tags, possible prolapsed internal hemorrhoid. Perianal wart.    Plan       The patient is in process of moving from [place] to [place]. He desires to postpone surgical intervention until this summer. He'll call back about 1 month prior to his desired surgery date and we will complete a brief visit prior to surgical intervention. He will be best served by having a procedure under local anesthesia with sedation as an outpatient.    PCP:  Selmer Dominion 02/01/2015, 2:10 PM

## 2015-01-31 NOTE — Patient Instructions (Signed)
The patient is aware to call back for any questions or concerns. Call back when ready for surgery

## 2015-02-01 DIAGNOSIS — K644 Residual hemorrhoidal skin tags: Secondary | ICD-10-CM | POA: Insufficient documentation

## 2015-06-28 ENCOUNTER — Encounter: Payer: Self-pay | Admitting: Family Medicine

## 2015-06-28 ENCOUNTER — Ambulatory Visit (INDEPENDENT_AMBULATORY_CARE_PROVIDER_SITE_OTHER): Payer: Managed Care, Other (non HMO) | Admitting: Family Medicine

## 2015-06-28 VITALS — BP 111/75 | HR 63 | Temp 98.5°F | Ht 66.0 in | Wt 186.8 lb

## 2015-06-28 DIAGNOSIS — L255 Unspecified contact dermatitis due to plants, except food: Secondary | ICD-10-CM

## 2015-06-28 DIAGNOSIS — L237 Allergic contact dermatitis due to plants, except food: Secondary | ICD-10-CM | POA: Insufficient documentation

## 2015-06-28 MED ORDER — METHYLPREDNISOLONE ACETATE 40 MG/ML IJ SUSP
40.0000 mg | Freq: Once | INTRAMUSCULAR | Status: AC
  Administered 2015-06-28: 40 mg via INTRAMUSCULAR

## 2015-06-28 MED ORDER — TRIAMCINOLONE ACETONIDE 0.5 % EX CREA: 1.0000 "application " | TOPICAL_CREAM | Freq: Two times a day (BID) | CUTANEOUS | Status: DC

## 2015-06-28 NOTE — Progress Notes (Signed)
Pre visit review using our clinic review tool, if applicable. No additional management support is needed unless otherwise documented below in the visit note. 

## 2015-06-28 NOTE — Patient Instructions (Signed)
Continue antihistamine and can use triamcinolone cream for itching. Call if not improving as expected for consideration of additional oral prednisone course.

## 2015-06-28 NOTE — Addendum Note (Signed)
Addended by: Carter Kitten on: 06/28/2015 12:46 PM   Modules accepted: Orders

## 2015-06-28 NOTE — Assessment & Plan Note (Signed)
Treat with  SQ depo-medrol x 1 in office. Continue antihistamine and can use triamcinolone cream for itching.

## 2015-06-28 NOTE — Progress Notes (Signed)
   Subjective:    Patient ID: Kyle Velasquez, male    DOB: [birth year], 54 y.o.   MRN: [id number]  HPI  54 year old male presents with new onset rash  on arm, hands , between finger, torso and legs in last week  Linear, ictcy, blisters with red background.  Triamcinolone helping a little.  No SOB, no difficulty swallowing. No fever.  He pulled a vine off  Gutters right prior to have rash.   Using benadryl and claritin D.   Review of Systems  Constitutional: Negative for fever and fatigue.  HENT: Negative for ear pain.   Eyes: Negative for pain.  Respiratory: Negative for cough and shortness of breath.   Cardiovascular: Negative for chest pain, palpitations and leg swelling.  Gastrointestinal: Negative for abdominal pain.  Genitourinary: Negative for dysuria.  Musculoskeletal: Negative for arthralgias.  Neurological: Negative for syncope, light-headedness and headaches.  Psychiatric/Behavioral: Negative for dysphoric mood.       Objective:   Physical Exam  Constitutional: Vital signs are normal. He appears well-developed and well-nourished.  HENT:  Head: Normocephalic.  Right Ear: Hearing normal.  Left Ear: Hearing normal.  Nose: Nose normal.  Mouth/Throat: Oropharynx is clear and moist and mucous membranes are normal.  Neck: Trachea normal. Carotid bruit is not present. No thyroid mass and no thyromegaly present.  Cardiovascular: Normal rate and normal pulses.  Exam reveals no gallop, no distant heart sounds and no friction rub.   No murmur heard. No peripheral edema  Pulmonary/Chest: Effort normal and breath sounds normal. No respiratory distress.  Skin: Skin is warm, dry and intact. No rash noted.  Linear blistering rash on hands, arms, neck, torso, legs, excoriations, no  Pustule, odor or discharge.  Psychiatric: He has a normal mood and affect. His speech is normal and behavior is normal. Thought content normal.          Assessment & Plan:

## 2015-08-15 ENCOUNTER — Ambulatory Visit (INDEPENDENT_AMBULATORY_CARE_PROVIDER_SITE_OTHER): Payer: Managed Care, Other (non HMO) | Admitting: General Surgery

## 2015-08-15 ENCOUNTER — Encounter: Payer: Self-pay | Admitting: General Surgery

## 2015-08-15 VITALS — BP 104/78 | HR 76 | Resp 14 | Ht 64.0 in | Wt 191.2 lb

## 2015-08-15 DIAGNOSIS — K644 Residual hemorrhoidal skin tags: Secondary | ICD-10-CM | POA: Diagnosis not present

## 2015-08-15 NOTE — Patient Instructions (Addendum)
Take stool softener before surgery, some bleeding is normal after surgery   Hemorrhoidectomy Hemorrhoidectomy is surgery to remove hemorrhoids. Hemorrhoids are veins that have become swollen in the rectum. The rectum is the area from the bottom end of the intestines to the opening where bowel movements leave the body. Hemorrhoids can be uncomfortable. They can cause itching, bleeding and pain if a blood clot forms in them (thrombose). If hemorrhoids are small, surgery may not be needed. But if they cover a larger area, surgery is usually suggested.  LET YOUR CAREGIVER KNOW ABOUT:   Any allergies.  All medications you are taking, including:  Herbs, eyedrops, over-the-counter medications and creams.  Blood thinners (anticoagulants), aspirin or other drugs that could affect blood clotting.  Use of steroids (by mouth or as creams).  Previous problems with anesthetics, including local anesthetics.  Possibility of pregnancy, if this applies.  Any history of blood clots.  Any history of bleeding or other blood problems.  Previous surgery.  Smoking history.  Other health problems. RISKS AND COMPLICATIONS All surgery carries some risk. However, hemorrhoid surgery usually goes smoothly. Possible complications could include:  Urinary retention.  Bleeding.  Infection.  A painful incision.  A reaction to the anesthesia (this is not common). BEFORE THE PROCEDURE   Stop using aspirin and non-steroidal anti-inflammatory drugs (NSAIDs) for pain relief. This includes prescription drugs and over-the-counter drugs such as ibuprofen and naproxen. Also stop taking vitamin E. If possible, do this two weeks before your surgery.  If you take blood-thinners, ask your healthcare provider when you should stop taking them.  You will probably have blood and urine tests done several days before your surgery.  Do not eat or drink for about 8 hours before the surgery.  Arrive at least an hour  before the surgery, or whenever your surgeon recommends. This will give you time to check in and fill out any needed paperwork.  Hemorrhoidectomy is often an outpatient procedure. This means you will be able to go home the same day. Sometimes, though, people stay overnight in the hospital after the procedure. Ask your surgeon what to expect. Either way, make arrangements in advance for someone to drive you home. PROCEDURE   The preparation:  You will change into a hospital gown.  You will be given an IV. A needle will be inserted in your arm. Medication can flow directly into your body through this needle.  You might be given an enema to clear your rectum.  Once in the operating room, you will probably lie on your side or be repositioned later to lying on your stomach.  You will be given anesthesia (medication) so you will not feel anything during the surgery. The surgery often is done with local anesthesia (the area near the hemorrhoids will be numb and you will be drowsy but awake). Sometimes, general anesthesia is used (you will be asleep during the procedure).  The procedure:  There are a few different procedures for hemorrhoids. Be sure to ask you surgeon about the procedure, the risks and benefits.  Be sure to ask about what you need to do to take care of the wound, if there is one. AFTER THE PROCEDURE  You will stay in a recovery area until the anesthesia has worn off. Your blood pressure and pulse will be checked every so often.  You may feel a lot of pain in the area of the rectum.  Take all pain medication prescribed by your surgeon. Ask before taking any  over-the-counter pain medicines.  Sometimes sitting in a warm bath can help relieve your pain.  To make sure you have bowel movements without straining:  You will probably need to take stool softeners (usually a pill) for a few days.  You should drink 8 to 10 glasses of water each day.  Your activity will be  restricted for awhile. Ask your caregiver for a list of what you should and should not do while you recover. Document Released: 09/20/2009 Document Revised: 02/15/2012 Document Reviewed: 09/20/2009 [facility] Patient Information 2015 [place], [place]. This information is not intended to replace advice given to you by your health care provider. Make sure you discuss any questions you have with your health care provider.  Patient's surgery has been scheduled for 09-09-15 at [facility].

## 2015-08-15 NOTE — Progress Notes (Addendum)
Patient ID: Kyle Velasquez, male   DOB: 1961/07/14, 54 y.o.   MRN: [id number]  Chief Complaint  Patient presents with  . Hemorrhoids    Anal Skin tag    HPI Kyle Velasquez is a 54 y.o. male here today for evalaution hemorrhoids and anal skin tag. He is wanting to schedule surgery. He states they have been there for about five years. No pain and they hemorrhoid area does not bleed. He states the area is hard to clean. Bowel movements are daily. His last colonoscopy was performed in 2013.  HPI  Past Medical History  Diagnosis Date  . Dyslipidemia 06/22/2012  . Hyperlipidemia     Past Surgical History  Procedure Laterality Date  . Vasectomy  2010    Dr. Purvis Kilts [facility])  . Colonoscopy  08/2012    WNL, sm int hemorrhoids, rec rpt 10 yrs (Pyrtle)    Family History  Problem Relation Age of Onset  . Hypertension Mother   . Hyperlipidemia Mother   . Other Father     Hypoglycemia  . Diabetes Maternal Grandfather   . Heart failure Maternal Grandmother   . Diabetes Paternal Grandfather   . Diabetes Paternal Grandmother   . Cancer Sister     breast  . Cancer Maternal Aunt     breast  . Coronary artery disease Paternal Uncle     MI  . Stroke Neg Hx   . Colon cancer Neg Hx   . Stomach cancer Neg Hx   . Cancer Maternal Uncle     prostate    Social History Social History  Substance Use Topics  . Smoking status: Former Smoker    Quit date: 12/07/1986  . Smokeless tobacco: Never Used  . Alcohol Use: Yes     Comment: Occasional    No Known Allergies  Current Outpatient Prescriptions  Medication Sig Dispense Refill  . Coenzyme Q10 (CO Q 10) 100 MG CAPS Take 2 tablets by mouth daily.    Marland Kitchen loratadine-pseudoephedrine (CLARITIN-D 24-HOUR) 10-240 MG per 24 hr tablet Take 1 tablet by mouth daily.    . Omega-3 Fatty Acids (OMEGA 3 500 PO) Take by mouth.    . Red Yeast Rice Extract 600 MG CAPS Take 2 capsules by mouth daily.     No current facility-administered medications for this  visit.    Review of Systems Review of Systems  Blood pressure 104/78, pulse 76, resp. rate 14, height 5\' 4"  (1.626 m), weight 191 lb 3.2 oz (86.728 kg).  Physical Exam Physical Exam  Constitutional: He is oriented to person, place, and time. He appears well-developed and well-nourished.  Cardiovascular: Normal rate, regular rhythm, normal heart sounds and intact distal pulses.   Pulmonary/Chest: Effort normal and breath sounds normal.  Abdominal: Soft. Bowel sounds are normal.  Genitourinary:  Left and right anterior anal skin tag  Neurological: He is alert and oriented to person, place, and time. He has normal reflexes.  Skin: Skin is warm and dry.  Psychiatric: His behavior is normal.      Assessment    Left and right anterior anal skin tag    Plan    Patient's surgery has been scheduled for 09-09-15 at [facility]     PCP: Selmer Dominion 08/20/2015, 12:28 PM

## 2015-08-16 ENCOUNTER — Encounter: Payer: Self-pay | Admitting: General Surgery

## 2015-08-19 ENCOUNTER — Other Ambulatory Visit (INDEPENDENT_AMBULATORY_CARE_PROVIDER_SITE_OTHER): Payer: Managed Care, Other (non HMO)

## 2015-08-19 DIAGNOSIS — E785 Hyperlipidemia, unspecified: Secondary | ICD-10-CM

## 2015-08-19 LAB — LIPID PANEL
Cholesterol: 244 mg/dL — ABNORMAL HIGH (ref 0–200)
HDL: 33.4 mg/dL — AB (ref >39.00)
NONHDL: 210.12
TRIGLYCERIDES: 243 mg/dL — AB (ref 0.0–149.0)
Total CHOL/HDL Ratio: 7
VLDL: 48.6 mg/dL — AB (ref 0.0–40.0)

## 2015-08-19 LAB — LDL CHOLESTEROL, DIRECT: Direct LDL: 158 mg/dL

## 2015-08-20 ENCOUNTER — Telehealth: Payer: Self-pay | Admitting: *Deleted

## 2015-08-20 ENCOUNTER — Other Ambulatory Visit: Payer: Self-pay | Admitting: General Surgery

## 2015-08-20 NOTE — Addendum Note (Signed)
Addended by: Robert Bellow on: 08/20/2015 12:29 PM   Modules accepted: Orders

## 2015-08-20 NOTE — Telephone Encounter (Signed)
Patient wanted to talk to you regarding the estimate for surgery that was sent to him in the mail. He just had a few questions about it. He said to call him on [phone] first, then if no answer call on the cell phone.

## 2015-08-20 NOTE — H&P (Signed)
Patient ID: Kyle Velasquez, male   DOB: Jan 04, 1961, 54 y.o.   MRN: [id number]  Chief Complaint  Patient presents with  . Hemorrhoids    Anal Skin tag    HPI Kyle Velasquez is a 54 y.o. male here today for evalaution hemorrhoids and anal skin tag. He is wanting to schedule surgery. He states they have been there for about five years. No pain and they hemorrhoid area does not bleed. He states the area is hard to clean. Bowel movements are daily. His last colonoscopy was performed in 2013.  HPI  Past Medical History  Diagnosis Date  . Dyslipidemia 06/22/2012  . Hyperlipidemia     Past Surgical History  Procedure Laterality Date  . Vasectomy  2010    Dr. Purvis [facility])  . Colonoscopy  08/2012    WNL, sm int hemorrhoids, rec rpt 10 yrs ([facility])    Family History  Problem Relation Age of Onset  . Hypertension Mother   . Hyperlipidemia Mother   . Other Father     Hypoglycemia  . Diabetes Maternal Grandfather   . Heart failure Maternal Grandmother   . Diabetes Paternal Grandfather   . Diabetes Paternal Grandmother   . Cancer Sister     breast  . Cancer Maternal Aunt     breast  . Coronary artery disease Paternal Uncle     MI  . Stroke Neg Hx   . Colon cancer Neg Hx   . Stomach cancer Neg Hx   . Cancer Maternal Uncle     prostate    Social History Social History  Substance Use Topics  . Smoking status: Former Smoker    Quit date: 12/07/1986  . Smokeless tobacco: Never Used  . Alcohol Use: Yes     Comment: Occasional    No Known Allergies  Current Outpatient Prescriptions  Medication Sig Dispense Refill  . Coenzyme Q10 (CO Q 10) 100 MG CAPS Take 2 tablets by mouth daily.    Marland Kitchen loratadine-pseudoephedrine (CLARITIN-D 24-HOUR) 10-240 MG per 24 hr tablet Take 1 tablet by mouth daily.    . Omega-3 Fatty Acids (OMEGA 3 500 PO) Take by mouth.    . Red Yeast Rice Extract 600 MG CAPS Take 2 capsules by mouth daily.     No current facility-administered medications for this  visit.    Review of Systems Review of Systems  Blood pressure 104/78, pulse 76, resp. rate 14, height 5\' 4"  (1.626 m), weight 191 lb 3.2 oz (86.728 kg).  Physical Exam Physical Exam  Constitutional: He is oriented to person, place, and time. He appears well-developed and well-nourished.  Cardiovascular: Normal rate, regular rhythm, normal heart sounds and intact distal pulses.   Pulmonary/Chest: Effort normal and breath sounds normal.  Abdominal: Soft. Bowel sounds are normal.  Genitourinary:  Left and right anterior anal skin tag  Neurological: He is alert and oriented to person, place, and time. He has normal reflexes.  Skin: Skin is warm and dry.  Psychiatric: His behavior is normal.      Assessment    Left and right anterior anal skin tag    Plan    Patient's surgery has been scheduled for 09-09-15 at [facility].     PCP: Ria Bush

## 2015-08-26 ENCOUNTER — Telehealth: Payer: Self-pay | Admitting: Family Medicine

## 2015-08-26 NOTE — Telephone Encounter (Signed)
Pt would like return call regarding labs please call [phone] Thanks

## 2015-08-26 NOTE — Telephone Encounter (Signed)
Message left for patient to return my call.  

## 2015-09-04 ENCOUNTER — Other Ambulatory Visit: Payer: Managed Care, Other (non HMO)

## 2015-09-04 ENCOUNTER — Encounter: Payer: Self-pay | Admitting: *Deleted

## 2015-09-04 NOTE — Patient Instructions (Signed)
  Your procedure is scheduled on: 09-09-15 Report to [place] To find out your arrival time please call [phone] between 1PM - 3PM on 09-06-15  Remember: Instructions that are not followed completely may result in serious medical risk, up to and including death, or upon the discretion of your surgeon and anesthesiologist your surgery may need to be rescheduled.    _X___ 1. Do not eat food or drink liquids after midnight. No gum chewing or hard candies.     _X___ 2. No Alcohol for 24 hours before or after surgery.   ____ 3. Bring all medications with you on the day of surgery if instructed.    ____ 4. Notify your doctor if there is any change in your medical condition     (cold, fever, infections).     Do not wear jewelry, make-up, hairpins, clips or nail polish.  Do not wear lotions, powders, or perfumes. You may wear deodorant.  Do not shave 48 hours prior to surgery. Men may shave face and neck.  Do not bring valuables to the hospital.    [facility] is not responsible for any belongings or valuables.               Contacts, dentures or bridgework may not be worn into surgery.  Leave your suitcase in the car. After surgery it may be brought to your room.  For patients admitted to the hospital, discharge time is determined by your  treatment team.   Patients discharged the day of surgery will not be allowed to drive home.   Please read over the following fact sheets that you were given:     ____ Take these medicines the morning of surgery with A SIP OF WATER:    1.NONE  2.   3.   4.  5.  6.  ____ Fleet Enema (as directed)   ____ Use CHG Soap as directed  ____ Use inhalers on the day of surgery  ____ Stop metformin 2 days prior to surgery    ____ Take 1/2 of usual insulin dose the night before surgery and none on the morning of surgery.   ____ Stop Coumadin/Plavix/aspirin-N/A  ____ Stop Anti-inflammatories   _X___ Stop  supplements until after surgery-STOP CO-Q 10, OMEGA 3, RED YEAST RICE NOW   ____ Bring C-Pap to the hospital.

## 2015-09-09 ENCOUNTER — Ambulatory Visit: Payer: Managed Care, Other (non HMO) | Admitting: Anesthesiology

## 2015-09-09 ENCOUNTER — Encounter
Admission: RE | Disposition: A | Discharge status: Home / Self Care | Payer: Self-pay | Source: Ambulatory Visit | Attending: General Surgery

## 2015-09-09 ENCOUNTER — Ambulatory Visit
Admission: RE | Admit: 2015-09-09 | Discharge: 2015-09-09 | Disposition: A | Discharge status: Home / Self Care | Payer: Managed Care, Other (non HMO) | Source: Ambulatory Visit | Attending: General Surgery | Admitting: General Surgery

## 2015-09-09 DIAGNOSIS — K648 Other hemorrhoids: Secondary | ICD-10-CM | POA: Diagnosis not present

## 2015-09-09 DIAGNOSIS — Z87891 Personal history of nicotine dependence: Secondary | ICD-10-CM | POA: Insufficient documentation

## 2015-09-09 DIAGNOSIS — Z9852 Vasectomy status: Secondary | ICD-10-CM | POA: Diagnosis not present

## 2015-09-09 DIAGNOSIS — Z8249 Family history of ischemic heart disease and other diseases of the circulatory system: Secondary | ICD-10-CM | POA: Diagnosis not present

## 2015-09-09 DIAGNOSIS — Z9889 Other specified postprocedural states: Secondary | ICD-10-CM | POA: Diagnosis not present

## 2015-09-09 DIAGNOSIS — Z833 Family history of diabetes mellitus: Secondary | ICD-10-CM | POA: Insufficient documentation

## 2015-09-09 DIAGNOSIS — E785 Hyperlipidemia, unspecified: Secondary | ICD-10-CM | POA: Diagnosis not present

## 2015-09-09 DIAGNOSIS — K644 Residual hemorrhoidal skin tags: Secondary | ICD-10-CM | POA: Insufficient documentation

## 2015-09-09 DIAGNOSIS — Z8042 Family history of malignant neoplasm of prostate: Secondary | ICD-10-CM | POA: Diagnosis not present

## 2015-09-09 DIAGNOSIS — Z803 Family history of malignant neoplasm of breast: Secondary | ICD-10-CM | POA: Insufficient documentation

## 2015-09-09 DIAGNOSIS — Z79899 Other long term (current) drug therapy: Secondary | ICD-10-CM | POA: Diagnosis not present

## 2015-09-09 HISTORY — PX: EXCISION OF SKIN TAG: SHX6270

## 2015-09-09 HISTORY — DX: Unspecified convulsions: R56.9

## 2015-09-09 SURGERY — EXCISION, SKIN TAG
Anesthesia: Monitor Anesthesia Care | Wound class: Clean Contaminated

## 2015-09-09 MED ORDER — PROPOFOL 500 MG/50ML IV EMUL
INTRAVENOUS | Status: DC | PRN
  Administered 2015-09-09: 25 ug/kg/min via INTRAVENOUS

## 2015-09-09 MED ORDER — HYDROCODONE-ACETAMINOPHEN 5-325 MG PO TABS: 1.0000 | ORAL_TABLET | ORAL | Status: DC | PRN

## 2015-09-09 MED ORDER — LACTATED RINGERS IV SOLN
INTRAVENOUS | Status: DC
  Administered 2015-09-09 (×2): via INTRAVENOUS

## 2015-09-09 MED ORDER — MIDAZOLAM HCL 2 MG/2ML IJ SOLN
INTRAMUSCULAR | Status: DC | PRN
  Administered 2015-09-09: 2 mg via INTRAVENOUS

## 2015-09-09 MED ORDER — FAMOTIDINE 20 MG PO TABS
ORAL_TABLET | ORAL | Status: AC
  Administered 2015-09-09: 20 mg via ORAL
  Filled 2015-09-09: qty 1

## 2015-09-09 MED ORDER — FENTANYL CITRATE (PF) 100 MCG/2ML IJ SOLN: 25.0000 ug | INTRAMUSCULAR | Status: DC | PRN

## 2015-09-09 MED ORDER — FAMOTIDINE 20 MG PO TABS
20.0000 mg | ORAL_TABLET | Freq: Once | ORAL | Status: AC
  Administered 2015-09-09: 20 mg via ORAL

## 2015-09-09 MED ORDER — ONDANSETRON HCL 4 MG/2ML IJ SOLN: 4.0000 mg | Freq: Once | INTRAMUSCULAR | Status: DC | PRN

## 2015-09-09 MED ORDER — BUPIVACAINE LIPOSOME 1.3 % IJ SUSP
INTRAMUSCULAR | Status: AC
  Filled 2015-09-09: qty 20

## 2015-09-09 MED ORDER — BUPIVACAINE-EPINEPHRINE (PF) 0.5% -1:200000 IJ SOLN
INTRAMUSCULAR | Status: AC
  Filled 2015-09-09: qty 30

## 2015-09-09 MED ORDER — BUPIVACAINE-EPINEPHRINE (PF) 0.5% -1:200000 IJ SOLN
INTRAMUSCULAR | Status: DC | PRN
  Administered 2015-09-09: 27 mL

## 2015-09-09 MED ORDER — FENTANYL CITRATE (PF) 100 MCG/2ML IJ SOLN
INTRAMUSCULAR | Status: DC | PRN
  Administered 2015-09-09: 100 ug via INTRAVENOUS

## 2015-09-09 SURGICAL SUPPLY — 25 items
BRIEF STRETCH MATERNITY 2XLG (MISCELLANEOUS) ×3 IMPLANT
CANISTER SUCT 1200ML W/VALVE (MISCELLANEOUS) ×3 IMPLANT
DRAPE LAPAROTOMY 100X77 ABD (DRAPES) ×3 IMPLANT
DRAPE LEGGINS SURG 28X43 STRL (DRAPES) ×3 IMPLANT
DRAPE UNDER BUTTOCK W/FLU (DRAPES) ×3 IMPLANT
DRSG GAUZE PETRO 6X36 STRIP ST (GAUZE/BANDAGES/DRESSINGS) IMPLANT
GLOVE BIO SURGEON STRL SZ7.5 (GLOVE) ×6 IMPLANT
GLOVE INDICATOR 8.0 STRL GRN (GLOVE) ×6 IMPLANT
GOWN STRL REUS W/ TWL LRG LVL3 (GOWN DISPOSABLE) ×2 IMPLANT
GOWN STRL REUS W/TWL LRG LVL3 (GOWN DISPOSABLE) ×4
HARMONIC SCALPEL FOCUS (MISCELLANEOUS) IMPLANT
KIT RM TURNOVER STRD PROC AR (KITS) ×3 IMPLANT
LABEL OR SOLS (LABEL) ×3 IMPLANT
NDL SAFETY 22GX1.5 (NEEDLE) ×3 IMPLANT
NEEDLE HYPO 25X1 1.5 SAFETY (NEEDLE) ×3 IMPLANT
PACK BASIN MINOR ARMC (MISCELLANEOUS) ×3 IMPLANT
PAD GROUND ADULT SPLIT (MISCELLANEOUS) ×3 IMPLANT
PAD OB MATERNITY 4.3X12.25 (PERSONAL CARE ITEMS) ×3 IMPLANT
PAD PREP 24X41 OB/GYN DISP (PERSONAL CARE ITEMS) ×3 IMPLANT
SURGILUBE 2OZ TUBE FLIPTOP (MISCELLANEOUS) ×3 IMPLANT
SUT CHROMIC 3 0 SH 27 (SUTURE) ×3 IMPLANT
SUT SILK 0 CT 1 30 (SUTURE) IMPLANT
SUT VIC AB 3-0 SH 27 (SUTURE) ×2
SUT VIC AB 3-0 SH 27X BRD (SUTURE) ×1 IMPLANT
SYR CONTROL 10ML (SYRINGE) ×3 IMPLANT

## 2015-09-09 NOTE — Anesthesia Postprocedure Evaluation (Signed)
  Anesthesia Post-op Note  Patient: Kyle Velasquez  Procedure(s) Performed: Procedure(s): EXCISION OF SKIN TAG/ANAL SKIN TAGS (N/A)  Anesthesia type:MAC  Patient location: PACU  Post pain: Pain level controlled  Post assessment: Post-op Vital signs reviewed, Patient's Cardiovascular Status Stable, Respiratory Function Stable, Patent Airway and No signs of Nausea or vomiting  Post vital signs: Reviewed and stable  Last Vitals:  Filed Vitals:   09/09/15 0843  BP: 114/80  Pulse: 62  Temp: 36.3 C  Resp: 19    Level of consciousness: awake, alert  and patient cooperative  Complications: No apparent anesthesia complications

## 2015-09-09 NOTE — Transfer of Care (Signed)
Immediate Anesthesia Transfer of Care Note  Patient: Kyle Velasquez  Procedure(s) Performed: Procedure(s): EXCISION OF SKIN TAG/ANAL SKIN TAGS (N/A)  Patient Location: PACU  Anesthesia Type:MAC  Level of Consciousness: awake  Airway & Oxygen Therapy: Patient Spontanous Breathing  Post-op Assessment: Report given to RN  Post vital signs: Reviewed  Last Vitals:  Filed Vitals:   09/09/15 0609  BP: 111/71  Pulse: 63  Temp: 36.3 C  Resp: 16    Complications: No apparent anesthesia complications

## 2015-09-09 NOTE — Anesthesia Preprocedure Evaluation (Signed)
Anesthesia Evaluation  Patient identified by MRN, date of birth, ID band Patient awake    Reviewed: Allergy & Precautions, NPO status , Patient's Chart, lab work & pertinent test results  Airway Mallampati: I  TM Distance: >3 FB Neck ROM: Full    Dental  (+) Teeth Intact   Pulmonary former smoker,    Pulmonary exam normal        Cardiovascular Exercise Tolerance: Good negative cardio ROS Normal cardiovascular exam     Neuro/Psych    GI/Hepatic negative GI ROS,   Endo/Other    Renal/GU      Musculoskeletal   Abdominal Normal abdominal exam  (+)   Peds  Hematology   Anesthesia Other Findings   Reproductive/Obstetrics                             Anesthesia Physical Anesthesia Plan  ASA: II  Anesthesia Plan: MAC   Post-op Pain Management:    Induction: Intravenous  Airway Management Planned: Simple Face Mask  Additional Equipment:   Intra-op Plan:   Post-operative Plan:   Informed Consent: I have reviewed the patients History and Physical, chart, labs and discussed the procedure including the risks, benefits and alternatives for the proposed anesthesia with the patient or authorized representative who has indicated his/her understanding and acceptance.     Plan Discussed with: CRNA  Anesthesia Plan Comments:         Anesthesia Quick Evaluation

## 2015-09-09 NOTE — Discharge Instructions (Signed)

## 2015-09-09 NOTE — Op Note (Signed)
Preoperative diagnosis: Anal skin tag.  Postoperative diagnosis: Same.  Procedure: Excision right anterior and posterior anal skin tag.  Operative surgeon: Hervey Ard, M.D.  Anesthesia: Attended local, 26 mL 0.5% Marcaine with 1-200,000 of epinephrine.  Estimated blood loss: Minimal.  Clinical note this 54 mL has 2 anal tags that are symptomatic. He was in for elective excision.  Operative note with the patient in dorsal lithotomy position the perineum was prepped with Betadine solution and draped. Marcaine was infiltrated for anesthesia. The lesions were excised with cautery and the skin defects closed with 3-0 chromic sutures. Anoscopy was unremarkable except for a mild, 1 cm internal hemorrhoid in the right posterior position unassociated with skin tag. This was not removed. Dry dressing is applied and the patient was taken to recovery in stable condition.

## 2015-09-09 NOTE — H&P (Signed)
No interval change in clinical history or exam. 

## 2015-09-10 ENCOUNTER — Telehealth: Payer: Self-pay | Admitting: *Deleted

## 2015-09-10 LAB — SURGICAL PATHOLOGY

## 2015-09-10 NOTE — Telephone Encounter (Signed)
-----   Message from Robert Bellow, MD sent at 09/10/2015 11:07 AM EDT ----- Please notify the patient that the biopsy reports from the lesions removed yesterday was fine. Follow-up next week as scheduled. ----- Message -----    From: [facility]    Sent: 09/10/2015  10:23 AM      To: Robert Bellow, MD

## 2015-09-10 NOTE — Telephone Encounter (Signed)
Left detailed message, as instructed. Pt to call back to let us know he got the message.

## 2015-09-16 ENCOUNTER — Ambulatory Visit (INDEPENDENT_AMBULATORY_CARE_PROVIDER_SITE_OTHER): Payer: Managed Care, Other (non HMO) | Admitting: General Surgery

## 2015-09-16 ENCOUNTER — Encounter: Payer: Self-pay | Admitting: General Surgery

## 2015-09-16 VITALS — BP 126/72 | HR 76 | Resp 12 | Ht 64.0 in | Wt 187.0 lb

## 2015-09-16 DIAGNOSIS — K644 Residual hemorrhoidal skin tags: Secondary | ICD-10-CM

## 2015-09-16 NOTE — Patient Instructions (Signed)
Patient to return as needed. 

## 2015-09-16 NOTE — Progress Notes (Signed)
Patient ID: Kyle Velasquez, male   DOB: [birth year], 54 y.o.   MRN: [id number]  Chief Complaint  Patient presents with  . Routine Post Op    excision of anal skin tag    HPI Kyle Velasquez is a 54 y.o. male excision of anal skin tag done on 09/09/15. Patient states he is doing well. Still bleeding a little.                                                                           HPI  Past Medical History  Diagnosis Date  . Dyslipidemia 06/22/2012  . Hyperlipidemia   . Seizures ([employer])     LAST HAD SEIZURE IN 1981    Past Surgical History  Procedure Laterality Date  . Vasectomy  2010    Dr. Purvis Kilts [facility])  . Colonoscopy  08/2012    WNL, sm int hemorrhoids, rec rpt 10 yrs ([facility])  . Excision of skin tag N/A 09/09/2015    Procedure: EXCISION OF SKIN TAG/ANAL SKIN TAGS;  Surgeon: Robert Bellow, MD;  Location: [facility] ORS;  Service: General;  Laterality: N/A;    Family History  Problem Relation Age of Onset  . Hypertension Mother   . Hyperlipidemia Mother   . Other Father     Hypoglycemia  . Diabetes Maternal Grandfather   . Heart failure Maternal Grandmother   . Diabetes Paternal Grandfather   . Diabetes Paternal Grandmother   . Cancer Sister     breast  . Cancer Maternal Aunt     breast  . Coronary artery disease Paternal Uncle     MI  . Stroke Neg Hx   . Colon cancer Neg Hx   . Stomach cancer Neg Hx   . Cancer Maternal Uncle     prostate    Social History Social History  Substance Use Topics  . Smoking status: Former Smoker    Types: Cigarettes    Quit date: 12/07/1986  . Smokeless tobacco: Never Used  . Alcohol Use: Yes     Comment: Occasional    No Known Allergies  Current Outpatient Prescriptions  Medication Sig Dispense Refill  . Coenzyme Q10 (CO Q 10) 100 MG CAPS Take 2 tablets by mouth daily.    Marland Kitchen HYDROcodone-acetaminophen (NORCO) 5-325 MG tablet Take 1-2 tablets by mouth every 4 (four) hours as needed for moderate pain. 30 tablet 0  .  loratadine-pseudoephedrine (CLARITIN-D 24-HOUR) 10-240 MG per 24 hr tablet Take 1 tablet by mouth as needed.     . Omega-3 Fatty Acids (OMEGA 3 500 PO) Take by mouth.    . Red Yeast Rice Extract 600 MG CAPS Take 2 capsules by mouth daily.     No current facility-administered medications for this visit.    Review of Systems Review of Systems  Constitutional: Negative.   Respiratory: Negative.   Cardiovascular: Negative.     Blood pressure 126/72, pulse 76, resp. rate 12, height 5\' 4"  (1.626 m), weight 187 lb (84.823 kg).  Physical Exam Physical Exam  Genitourinary:       Data Reviewed  Surgical Pathology  CASE: [id number]  PATIENT: Kyle Velasquez  Surgical Pathology Report      SPECIMEN SUBMITTED:  A. Right  interior anal skin tag  B. Right posterior skin tag   CLINICAL HISTORY:  None provided   PRE-OPERATIVE DIAGNOSIS:  Anal skin tag   POST-OPERATIVE DIAGNOSIS:  Anal skin tag      DIAGNOSIS:  A. ANAL SKIN TAG, RIGHT INTERIOR; EXCISION:  - SKIN TAG.   B. SKIN TAG, RIGHT POSTERIOR; EXCISION:  - SKIN TAG.               Assessment    Doing well status post excision of hypertrophic anal skin tags.    Plan    The area should heal completely by the end of the month. The patient will report if he has any further difficulties.    Patient to return as needed.  PCP:  Janeece Agee, Forest Gleason 09/16/2015, 7:58 PM

## 2017-11-29 ENCOUNTER — Encounter: Payer: Managed Care, Other (non HMO) | Admitting: Family Medicine

## 2017-12-02 ENCOUNTER — Ambulatory Visit (INDEPENDENT_AMBULATORY_CARE_PROVIDER_SITE_OTHER): Payer: Managed Care, Other (non HMO) | Admitting: Family Medicine

## 2017-12-02 ENCOUNTER — Encounter: Payer: Self-pay | Admitting: Family Medicine

## 2017-12-02 VITALS — BP 122/68 | HR 65 | Temp 98.2°F | Ht 65.5 in | Wt 182.2 lb

## 2017-12-02 DIAGNOSIS — Z0000 Encounter for general adult medical examination without abnormal findings: Secondary | ICD-10-CM | POA: Diagnosis not present

## 2017-12-02 DIAGNOSIS — Z125 Encounter for screening for malignant neoplasm of prostate: Secondary | ICD-10-CM

## 2017-12-02 DIAGNOSIS — E785 Hyperlipidemia, unspecified: Secondary | ICD-10-CM

## 2017-12-02 DIAGNOSIS — R202 Paresthesia of skin: Secondary | ICD-10-CM

## 2017-12-02 DIAGNOSIS — Z1159 Encounter for screening for other viral diseases: Secondary | ICD-10-CM | POA: Diagnosis not present

## 2017-12-02 DIAGNOSIS — E663 Overweight: Secondary | ICD-10-CM

## 2017-12-02 LAB — COMPREHENSIVE METABOLIC PANEL
ALT: 56 U/L — AB (ref 0–53)
AST: 26 U/L (ref 0–37)
Albumin: 4.3 g/dL (ref 3.5–5.2)
Alkaline Phosphatase: 57 U/L (ref 39–117)
BUN: 21 mg/dL (ref 6–23)
CALCIUM: 9.3 mg/dL (ref 8.4–10.5)
CHLORIDE: 108 meq/L (ref 96–112)
CO2: 27 mEq/L (ref 19–32)
Creatinine, Ser: 1.34 mg/dL (ref 0.40–1.50)
GFR: 58.53 mL/min — AB (ref >60.00)
Glucose, Bld: 91 mg/dL (ref 70–99)
POTASSIUM: 4.1 meq/L (ref 3.5–5.1)
Sodium: 140 mEq/L (ref 135–145)
TOTAL PROTEIN: 6.9 g/dL (ref 6.0–8.3)
Total Bilirubin: 0.4 mg/dL (ref 0.2–1.2)

## 2017-12-02 LAB — TSH: TSH: 2.31 u[IU]/mL (ref 0.35–4.50)

## 2017-12-02 LAB — VITAMIN B12: VITAMIN B 12: 308 pg/mL (ref 211–911)

## 2017-12-02 LAB — LIPID PANEL
CHOL/HDL RATIO: 6
CHOLESTEROL: 242 mg/dL — AB (ref 0–200)
HDL: 37.3 mg/dL — ABNORMAL LOW (ref >39.00)
NONHDL: 204.46
TRIGLYCERIDES: 227 mg/dL — AB (ref 0.0–149.0)
VLDL: 45.4 mg/dL — ABNORMAL HIGH (ref 0.0–40.0)

## 2017-12-02 LAB — LDL CHOLESTEROL, DIRECT: Direct LDL: 171 mg/dL

## 2017-12-02 LAB — PSA: PSA: 0.76 ng/mL (ref 0.10–4.00)

## 2017-12-02 NOTE — Assessment & Plan Note (Signed)
Intermittent, of scalp, left side, and left leg.  Check B12, TSH today.

## 2017-12-02 NOTE — Assessment & Plan Note (Signed)
Reviewed healthy diet and lifestyle changes to affect sustainable weight loss.  

## 2017-12-02 NOTE — Patient Instructions (Addendum)
You are doing well today Labs today Return as needed or in 1 year for next physical.  Health Maintenance, Male A healthy lifestyle and preventive care is important for your health and wellness. Ask your health care provider about what schedule of regular examinations is right for you. What should I know about weight and diet? Eat a Healthy Diet  Eat plenty of vegetables, fruits, whole grains, low-fat dairy products, and lean protein.  Do not eat a lot of foods high in solid fats, added sugars, or salt.  Maintain a Healthy Weight Regular exercise can help you achieve or maintain a healthy weight. You should:  Do at least 150 minutes of exercise each week. The exercise should increase your heart rate and make you sweat (moderate-intensity exercise).  Do strength-training exercises at least twice a week.  Watch Your Levels of Cholesterol and Blood Lipids  Have your blood tested for lipids and cholesterol every 5 years starting at 56 years of age. If you are at high risk for heart disease, you should start having your blood tested when you are 56 years old. You may need to have your cholesterol levels checked more often if: ? Your lipid or cholesterol levels are high. ? You are older than 56 years of age. ? You are at high risk for heart disease.  What should I know about cancer screening? Many types of cancers can be detected early and may often be prevented. Lung Cancer  You should be screened every year for lung cancer if: ? You are a current smoker who has smoked for at least 30 years. ? You are a former smoker who has quit within the past 15 years.  Talk to your health care provider about your screening options, when you should start screening, and how often you should be screened.  Colorectal Cancer  Routine colorectal cancer screening usually begins at 56 years of age and should be repeated every 5-10 years until you are 56 years old. You may need to be screened more often  if early forms of precancerous polyps or small growths are found. Your health care provider may recommend screening at an earlier age if you have risk factors for colon cancer.  Your health care provider may recommend using home test kits to check for hidden blood in the stool.  A small camera at the end of a tube can be used to examine your colon (sigmoidoscopy or colonoscopy). This checks for the earliest forms of colorectal cancer.  Prostate and Testicular Cancer  Depending on your age and overall health, your health care provider may do certain tests to screen for prostate and testicular cancer.  Talk to your health care provider about any symptoms or concerns you have about testicular or prostate cancer.  Skin Cancer  Check your skin from head to toe regularly.  Tell your health care provider about any new moles or changes in moles, especially if: ? There is a change in a mole's size, shape, or color. ? You have a mole that is larger than a pencil eraser.  Always use sunscreen. Apply sunscreen liberally and repeat throughout the day.  Protect yourself by wearing long sleeves, pants, a wide-brimmed hat, and sunglasses when outside.  What should I know about heart disease, diabetes, and high blood pressure?  If you are 18-39 years of age, have your blood pressure checked every 3-5 years. If you are 40 years of age or older, have your blood pressure checked every year. You   every year. You should have your blood pressure measured twice-once when you are at a hospital or clinic, and once when you are not at a hospital or clinic. Record the average of the two measurements. To check your blood pressure when you are not at a hospital or clinic, you can use: ? An automated blood pressure machine at a pharmacy. ? A home blood pressure monitor.  Talk to your health care provider about your target blood pressure.  If you are between 66-78 years old, ask your health care provider if you should take  aspirin to prevent heart disease.  Have regular diabetes screenings by checking your fasting blood sugar level. ? If you are at a normal weight and have a low risk for diabetes, have this test once every three years after the age of 69. ? If you are overweight and have a high risk for diabetes, consider being tested at a younger age or more often.  A one-time screening for abdominal aortic aneurysm (AAA) by ultrasound is recommended for men aged 64-75 years who are current or former smokers. What should I know about preventing infection? Hepatitis B If you have a higher risk for hepatitis B, you should be screened for this virus. Talk with your health care provider to find out if you are at risk for hepatitis B infection. Hepatitis C Blood testing is recommended for:  Everyone born from [birth year] through 1965.  Anyone with known risk factors for hepatitis C.  Sexually Transmitted Diseases (STDs)  You should be screened each year for STDs including gonorrhea and chlamydia if: ? You are sexually active and are younger than 56 years of age. ? You are older than 56 years of age and your health care provider tells you that you are at risk for this type of infection. ? Your sexual activity has changed since you were last screened and you are at an increased risk for chlamydia or gonorrhea. Ask your health care provider if you are at risk.  Talk with your health care provider about whether you are at high risk of being infected with HIV. Your health care provider may recommend a prescription medicine to help prevent HIV infection.  What else can I do?  Schedule regular health, dental, and eye exams.  Stay current with your vaccines (immunizations).  Do not use any tobacco products, such as cigarettes, chewing tobacco, and e-cigarettes. If you need help quitting, ask your health care provider.  Limit alcohol intake to no more than 2 drinks per day. One drink equals 12 ounces of beer, 5 ounces of  wine, or 1 ounces of hard liquor.  Do not use street drugs.  Do not share needles.  Ask your health care provider for help if you need support or information about quitting drugs.  Tell your health care provider if you often feel depressed.  Tell your health care provider if you have ever been abused or do not feel safe at home. This information is not intended to replace advice given to you by your health care provider. Make sure you discuss any questions you have with your health care provider. Document Released: 05/21/2008 Document Revised: 07/22/2016 Document Reviewed: 08/27/2015 Elsevier Interactive Patient Education  [employer].

## 2017-12-02 NOTE — Assessment & Plan Note (Signed)
Preventative protocols reviewed and updated unless pt declined. Discussed healthy diet and lifestyle.  

## 2017-12-02 NOTE — Assessment & Plan Note (Signed)
Chronic, has stopped supplements (fish oil, RYR, coQ 10). Will update FLP today.  The 10-year ASCVD risk score Kyle Velasquez., et al., 2013) is: 9.8%   Values used to calculate the score:     Age: 56 years     Sex: Male     Is Non-Hispanic African American: No     Diabetic: No     Tobacco smoker: No     Systolic Blood Pressure: 784 mmHg     Is BP treated: No     HDL Cholesterol: 33.4 mg/dL     Total Cholesterol: 244 mg/dL

## 2017-12-02 NOTE — Progress Notes (Signed)
BP 122/68 (BP Location: Left Arm, Patient Position: Sitting, Cuff Size: Normal)   Pulse 65   Temp 98.2 F (36.8 C) (Oral)   Ht 5' 5.5" (1.664 m)   Wt 182 lb 4 oz (82.7 kg)   SpO2 97%   BMI 29.87 kg/m    CC: CPE Subjective:    Patient ID: Kyle Velasquez, male    DOB: 1960-12-23, 56 y.o.   MRN: [id number]  HPI: Kyle Velasquez is a 56 y.o. male presenting on 12/02/2017 for Annual Exam   Some intermittent burning painful skin sensation "like rug burn" at apex of scalp or left back or left leg. No skin changes.   Several weeks ago had some sharp chest discomfort with radiation to back. He does use zantac regularly.   Preventative: COLONOSCOPY 08/2012 WNL, sm int hemorrhoids, rec rpt 10 yrs ([facility]) Prostate screening - would like yearly screen Flu shot yearly  Td 2011  Seat belt use discussed  Sunscreen use discussed. No changing moles. Ex smoker, quit 1988 Alcohol - rarely  Caffeine use/day: 2-3 cups coffee/day  Lives with wife, 2 daughters, 1 dog.  Occupation: Network engineer  Exercise - basketball weekly, walking 2 miles a day Diet: some water, fruits/vegetables occasionally, red meat 1x/wk, fish rarely, backing off carbs  Relevant past medical, surgical, family and social history reviewed and updated as indicated. Interim medical history since our last visit reviewed. Allergies and medications reviewed and updated. Outpatient Medications Prior to Visit  Medication Sig Dispense Refill  . Coenzyme Q10 (CO Q 10) 100 MG CAPS Take 2 tablets by mouth daily.    Marland Kitchen loratadine-pseudoephedrine (CLARITIN-D 24-HOUR) 10-240 MG per 24 hr tablet Take 1 tablet by mouth as needed.     . Omega-3 Fatty Acids (OMEGA 3 500 PO) Take by mouth.    . Red Yeast Rice Extract 600 MG CAPS Take 2 capsules by mouth daily.    Marland Kitchen HYDROcodone-acetaminophen (NORCO) 5-325 MG tablet Take 1-2 tablets by mouth every 4 (four) hours as needed for moderate pain. (Patient not taking: Reported on  12/02/2017) 30 tablet 0   No facility-administered medications prior to visit.      Per HPI unless specifically indicated in ROS section below Review of Systems  Constitutional: Negative for activity change, appetite change, chills, fatigue, fever and unexpected weight change.  HENT: Negative for hearing loss.   Eyes: Negative for visual disturbance.  Respiratory: Negative for cough, chest tightness, shortness of breath and wheezing.   Cardiovascular: Positive for chest pain (see HPI). Negative for palpitations and leg swelling.  Gastrointestinal: Negative for abdominal distention, abdominal pain, blood in stool, constipation, diarrhea, nausea and vomiting.  Genitourinary: Negative for difficulty urinating and hematuria.  Musculoskeletal: Negative for arthralgias, myalgias and neck pain.  Skin: Negative for rash.  Neurological: Negative for dizziness, seizures, syncope and headaches.  Hematological: Negative for adenopathy. Does not bruise/bleed easily.  Psychiatric/Behavioral: Negative for dysphoric mood. The patient is not nervous/anxious.        Objective:    BP 122/68 (BP Location: Left Arm, Patient Position: Sitting, Cuff Size: Normal)   Pulse 65   Temp 98.2 F (36.8 C) (Oral)   Ht 5' 5.5" (1.664 m)   Wt 182 lb 4 oz (82.7 kg)   SpO2 97%   BMI 29.87 kg/m   Wt Readings from Last 3 Encounters:  12/02/17 182 lb 4 oz (82.7 kg)  09/16/15 187 lb (84.8 kg)  09/09/15 187 lb (84.8 kg)    Physical Exam  Constitutional: He is oriented to person, place, and time. He appears well-developed and well-nourished. No distress.  HENT:  Head: Normocephalic and atraumatic.  Right Ear: Hearing, tympanic membrane, external ear and ear canal normal.  Left Ear: Hearing, tympanic membrane, external ear and ear canal normal.  Nose: Nose normal.  Mouth/Throat: Uvula is midline, oropharynx is clear and moist and mucous membranes are normal. No oropharyngeal exudate, posterior oropharyngeal edema  or posterior oropharyngeal erythema.  Eyes: Conjunctivae and EOM are normal. Pupils are equal, round, and reactive to light. No scleral icterus.  Neck: Normal range of motion. Neck supple. No thyromegaly present.  Cardiovascular: Normal rate, regular rhythm, normal heart sounds and intact distal pulses.  No murmur heard. Pulses:      Radial pulses are 2+ on the right side, and 2+ on the left side.  Pulmonary/Chest: Effort normal and breath sounds normal. No respiratory distress. He has no wheezes. He has no rales.  Abdominal: Soft. Bowel sounds are normal. He exhibits no distension and no mass. There is no tenderness. There is no rebound and no guarding.  Genitourinary: Rectum normal and prostate normal. Rectal exam shows no external hemorrhoid, no fissure, no mass, no tenderness and anal tone normal. Prostate is not enlarged (15gm) and not tender.  Musculoskeletal: Normal range of motion. He exhibits no edema.  Lymphadenopathy:    He has no cervical adenopathy.  Neurological: He is alert and oriented to person, place, and time.  CN grossly intact, station and gait intact  Skin: Skin is warm and dry. No rash noted.  Psychiatric: He has a normal mood and affect. His behavior is normal. Judgment and thought content normal.  Nursing note and vitals reviewed.      Assessment & Plan:   Problem List Items Addressed This Visit    Dyslipidemia    Chronic, has stopped supplements (fish oil, RYR, coQ 10). Will update FLP today.  The 10-year ASCVD risk score Mikey Bussing DC Brooke Bonito., et al., 2013) is: 9.8%   Values used to calculate the score:     Age: 12 years     Sex: Male     Is Non-Hispanic African American: No     Diabetic: No     Tobacco smoker: No     Systolic Blood Pressure: 376 mmHg     Is BP treated: No     HDL Cholesterol: 33.4 mg/dL     Total Cholesterol: 244 mg/dL       Relevant Orders   Lipid panel   Comprehensive metabolic panel   Healthcare maintenance - Primary    Preventative  protocols reviewed and updated unless pt declined. Discussed healthy diet and lifestyle.       Overweight (BMI 25.0-29.9)    Reviewed healthy diet and lifestyle changes to affect sustainable weight loss.      Paresthesias    Intermittent, of scalp, left side, and left leg.  Check B12, TSH today.       Relevant Orders   TSH   Vitamin B12    Other Visit Diagnoses    Need for hepatitis C screening test       Relevant Orders   Hepatitis C antibody   Special screening for malignant neoplasm of prostate       Relevant Orders   PSA       Follow up plan: Return in about 1 year (around 12/02/2018) for annual exam, prior fasting for blood work.  Ria Bush, MD

## 2017-12-03 LAB — HEPATITIS C ANTIBODY
HEP C AB: NONREACTIVE
SIGNAL TO CUT-OFF: 0.02 (ref <1.00)

## 2017-12-04 ENCOUNTER — Encounter: Payer: Self-pay | Admitting: Family Medicine

## 2018-07-11 ENCOUNTER — Encounter: Payer: Self-pay | Admitting: Family Medicine

## 2018-07-11 ENCOUNTER — Ambulatory Visit: Payer: Managed Care, Other (non HMO) | Admitting: Family Medicine

## 2018-07-11 VITALS — BP 122/84 | HR 61 | Temp 98.0°F | Ht 65.5 in | Wt 182.2 lb

## 2018-07-11 DIAGNOSIS — R229 Localized swelling, mass and lump, unspecified: Secondary | ICD-10-CM | POA: Diagnosis not present

## 2018-07-11 NOTE — Assessment & Plan Note (Signed)
1. Scalp anticipate benign nevus - reassured. Will monitor and let me know if changing or growing. 2. Anticipate chest wall lesion likely benign scarred nodule. Reassured. rec let us know if changing or enlarging.  Pt agrees with plan.

## 2018-07-11 NOTE — Progress Notes (Signed)
BP 122/84 (BP Location: Left Arm, Patient Position: Sitting, Cuff Size: Normal)   Pulse 61   Temp 98 F (36.7 C) (Oral)   Ht 5' 5.5" (1.664 m)   Wt 182 lb 4 oz (82.7 kg)   SpO2 98%   BMI 29.87 kg/m    CC: check mass Subjective:    Patient ID: Kyle Velasquez, male    DOB: Dec 14, 1960, 57 y.o.   MRN: [id number]  HPI: Kyle Velasquez is a 57 y.o. male presenting on 07/11/2018 for Mass (C/o lump on left side of chest.  Noticed about 2 mos ago. Denies any pain/discomfort. Also, has lump on head pt wants checked. Pt is accomapined by his wife. )   Here with wife Kyle Velasquez.  Would like L scalp lump checked - for at least a year. No change. Not itchy or tender.   1 mo h/o L chest lump noted. Not tender or itchy. Not growing or changing.   Relevant past medical, surgical, family and social history reviewed and updated as indicated. Interim medical history since our last visit reviewed. Allergies and medications reviewed and updated. Outpatient Medications Prior to Visit  Medication Sig Dispense Refill  . Coenzyme Q10 (CO Q 10) 100 MG CAPS Take 2 tablets by mouth daily.    Marland Kitchen loratadine-pseudoephedrine (CLARITIN-D 24-HOUR) 10-240 MG per 24 hr tablet Take 1 tablet by mouth as needed.     . Omega-3 Fatty Acids (OMEGA 3 500 PO) Take by mouth.    . Red Yeast Rice Extract 600 MG CAPS Take 2 capsules by mouth daily.     No facility-administered medications prior to visit.      Per HPI unless specifically indicated in ROS section below Review of Systems     Objective:    BP 122/84 (BP Location: Left Arm, Patient Position: Sitting, Cuff Size: Normal)   Pulse 61   Temp 98 F (36.7 C) (Oral)   Ht 5' 5.5" (1.664 m)   Wt 182 lb 4 oz (82.7 kg)   SpO2 98%   BMI 29.87 kg/m   Wt Readings from Last 3 Encounters:  07/11/18 182 lb 4 oz (82.7 kg)  12/02/17 182 lb 4 oz (82.7 kg)  09/16/15 187 lb (84.8 kg)    Physical Exam  Constitutional: He appears well-developed and well-nourished. No  distress.  Skin: Skin is warm and dry. No rash noted. No erythema.     1. subcm flesh colored nodule L scalp not tender  2. Left anterior mid ribcage with nontender nodule  Nursing note and vitals reviewed.  Results for orders placed or performed in visit on 12/02/17  Lipid panel  Result Value Ref Range   Cholesterol 242 (H) 0 - 200 mg/dL   Triglycerides 227.0 (H) 0.0 - 149.0 mg/dL   HDL 37.30 (L) >39.00 mg/dL   VLDL 45.4 (H) 0.0 - 40.0 mg/dL   Total CHOL/HDL Ratio 6    NonHDL 204.46   Comprehensive metabolic panel  Result Value Ref Range   Sodium 140 135 - 145 mEq/L   Potassium 4.1 3.5 - 5.1 mEq/L   Chloride 108 96 - 112 mEq/L   CO2 27 19 - 32 mEq/L   Glucose, Bld 91 70 - 99 mg/dL   BUN 21 6 - 23 mg/dL   Creatinine, Ser 1.34 0.40 - 1.50 mg/dL   Total Bilirubin 0.4 0.2 - 1.2 mg/dL   Alkaline Phosphatase 57 39 - 117 U/L   AST 26 0 - 37 U/L   ALT  56 (H) 0 - 53 U/L   Total Protein 6.9 6.0 - 8.3 g/dL   Albumin 4.3 3.5 - 5.2 g/dL   Calcium 9.3 8.4 - 10.5 mg/dL   GFR 58.53 (L) >60.00 mL/min  TSH  Result Value Ref Range   TSH 2.31 0.35 - 4.50 uIU/mL  PSA  Result Value Ref Range   PSA 0.76 0.10 - 4.00 ng/mL  Hepatitis C antibody  Result Value Ref Range   Hepatitis C Ab NON-REACTIVE NON-REACTI   SIGNAL TO CUT-OFF 0.02 <1.00  Vitamin B12  Result Value Ref Range   Vitamin B-12 308 211 - 911 pg/mL  LDL cholesterol, direct  Result Value Ref Range   Direct LDL 171.0 mg/dL      Assessment & Plan:   Problem List Items Addressed This Visit    Skin nodule - Primary    1. Scalp anticipate benign nevus - reassured. Will monitor and let me know if changing or growing. 2. Anticipate chest wall lesion likely benign scarred nodule. Reassured. rec let us know if changing or enlarging.  Pt agrees with plan.           No orders of the defined types were placed in this encounter.  No orders of the defined types were placed in this encounter.   Follow up plan: No follow-ups  on file.  Ria Bush, MD

## 2019-02-05 DIAGNOSIS — H35372 Puckering of macula, left eye: Secondary | ICD-10-CM

## 2019-02-05 HISTORY — DX: Puckering of macula, left eye: H35.372

## 2019-02-14 ENCOUNTER — Encounter: Payer: Self-pay | Admitting: Family Medicine

## 2019-03-31 ENCOUNTER — Telehealth: Payer: Self-pay | Admitting: Oncology

## 2019-04-03 ENCOUNTER — Encounter: Payer: Self-pay | Admitting: Oncology

## 2019-04-03 ENCOUNTER — Inpatient Hospital Stay: Payer: Managed Care, Other (non HMO) | Attending: Oncology | Admitting: Oncology

## 2019-04-03 ENCOUNTER — Other Ambulatory Visit: Payer: Self-pay

## 2019-04-03 DIAGNOSIS — R7989 Other specified abnormal findings of blood chemistry: Secondary | ICD-10-CM | POA: Diagnosis not present

## 2019-04-03 DIAGNOSIS — Z79899 Other long term (current) drug therapy: Secondary | ICD-10-CM

## 2019-04-03 DIAGNOSIS — Z87891 Personal history of nicotine dependence: Secondary | ICD-10-CM

## 2019-04-03 DIAGNOSIS — Z8042 Family history of malignant neoplasm of prostate: Secondary | ICD-10-CM

## 2019-04-03 DIAGNOSIS — Z803 Family history of malignant neoplasm of breast: Secondary | ICD-10-CM | POA: Diagnosis not present

## 2019-04-03 NOTE — Progress Notes (Signed)
HEMATOLOGY-ONCOLOGY TeleHEALTH VISIT PROGRESS NOTE  I connected with Kyle Velasquez on 04/03/19 at  9:30 AM EDT by video enabled telemedicine visit and verified that I am speaking with the correct person using two identifiers. I discussed the limitations, risks, security and privacy concerns of performing an evaluation and management service by telemedicine and the availability of in-person appointments. I also discussed with the patient that there may be a patient responsible charge related to this service. The patient expressed understanding and agreed to proceed.   Other persons participating in the visit and their role in the encounter:  Geraldine Solar, CMA, check in patient     Patient's location: work  Provider's location: home  Referral [place].   Chief Complaint: To establish care and evaluation of elevated ferritin.   INTERVAL HISTORY Kyle Velasquez is a 58 y.o. male who has above history reviewed by me today presents for initial consultation for the evaluation of elevated ferritin  Patient follows up with Dr. Ouida Sills PCP and recently had labs done.  He has normal liver function.  Ferritin level was elevated at 628 on 02/22/2019. he denies any family history of hemochromatosis or anyone with condition needs frequent blood removal. He also denies any fatigue, joint pain, abdominal pain or leg swelling. Denies alcohol use Patient had colonoscopy done on 08/27/2019 which showed normal colon and a small internal hemorrhoids.  Review of Systems  Constitutional: Positive for fatigue. Negative for appetite change, chills, fever and unexpected weight change.  HENT:   Negative for hearing loss and voice change.   Eyes: Negative for eye problems and icterus.  Respiratory: Negative for chest tightness, cough and shortness of breath.   Cardiovascular: Negative for chest pain and leg swelling.  Gastrointestinal: Negative for abdominal distention and abdominal pain.   Endocrine: Negative for hot flashes.  Genitourinary: Negative for difficulty urinating, dysuria and frequency.   Musculoskeletal: Negative for arthralgias.  Skin: Negative for itching and rash.  Neurological: Negative for light-headedness and numbness.  Hematological: Negative for adenopathy. Does not bruise/bleed easily.  Psychiatric/Behavioral: Negative for confusion.    Past Medical History:  Diagnosis Date  . Dyslipidemia 06/22/2012  . Epiretinal membrane (ERM) of left eye 02/2019  . Hyperlipidemia   . Seizures ([place])    LAST HAD SEIZURE IN 1981   Past Surgical History:  Procedure Laterality Date  . COLONOSCOPY  08/2012   WNL, sm int hemorrhoids, rec rpt 10 yrs (Pyrt[facility])  . EXCISION OF SKIN TAG N/A 09/09/2015   Procedure: EXCISION OF SKIN TAG/ANAL SKIN TAGS;  Surgeon: Robert Bellow, MD;  Location: [facility] ORS;  Service: General;  Laterality: N/A;  . VASECTOMY  2010   Dr. Purvis Kilts [facility])    Family History  Problem Relation Age of Onset  . Hypertension Mother   . Hyperlipidemia Mother   . Other Father        Hypoglycemia  . HIV/AIDS Father        from blood transfusion   . Diabetes Maternal Grandfather   . Heart failure Maternal Grandmother   . Diabetes Paternal Grandfather   . Diabetes Paternal Grandmother   . Breast cancer Sister   . Breast cancer Maternal Aunt   . Coronary artery disease Paternal Uncle        MI  . Prostate cancer Maternal Uncle   . Breast cancer Paternal Aunt   . Stroke Neg Hx   . Colon cancer Neg Hx   . Stomach cancer Neg Hx  Social History   Socioeconomic History  . Marital status: Married    Spouse name: Not on file  . Number of children: 2  . Years of education: Not on file  . Highest education level: Not on file  Occupational History  . Occupation: Sales promotion account executive  Social Needs  . Financial resource strain: Not on file  . Food insecurity:    Worry: Not on file    Inability: Not on file  . Transportation needs:    Medical:  Not on file    Non-medical: Not on file  Tobacco Use  . Smoking status: Former Smoker    Packs/day: 1.00    Years: 5.00    Pack years: 5.00    Types: Cigarettes    Last attempt to quit: 12/07/1986    Years since quitting: 32.3  . Smokeless tobacco: Never Used  Substance and Sexual Activity  . Alcohol use: Yes    Comment: Occasional  . Drug use: No  . Sexual activity: Not on file  Lifestyle  . Physical activity:    Days per week: Not on file    Minutes per session: Not on file  . Stress: Not on file  Relationships  . Social connections:    Talks on phone: Not on file    Gets together: Not on file    Attends religious service: Not on file    Active member of club or organization: Not on file    Attends meetings of clubs or organizations: Not on file    Relationship status: Not on file  . Intimate partner violence:    Fear of current or ex partner: Not on file    Emotionally abused: Not on file    Physically abused: Not on file    Forced sexual activity: Not on file  Other Topics Concern  . Not on file  Social History Narrative   Caffeine use/day: 2-3 cups coffee/day    Lives with wife, 2 daughters, 1 dog.    Occupation: Network engineer    Exercise - basketball weekly, walking 2 miles a day    Diet: some water, fruits/vegetables occasionally, red meat 1x/wk, fish rarely    Current Outpatient Medications on File Prior to Visit  Medication Sig Dispense Refill  . atorvastatin (LIPITOR) 20 MG tablet Take 20 mg by mouth daily.    . Coenzyme Q10 (CO Q 10) 100 MG CAPS Take 2 tablets by mouth daily.    Marland Kitchen loratadine-pseudoephedrine (CLARITIN-D 24-HOUR) 10-240 MG per 24 hr tablet Take 1 tablet by mouth as needed.     . Omega-3 Fatty Acids (OMEGA 3 500 PO) Take by mouth.     No current facility-administered medications on file prior to visit.     No Known Allergies     Observations/Objective: Today's Vitals   04/03/19 0930  PainSc: 0-No pain   There is no height  or weight on file to calculate BMI.  Physical Exam  Constitutional: He is oriented to person, place, and time and well-developed, well-nourished, and in no distress. No distress.  HENT:  Head: Normocephalic and atraumatic.  Neck: Normal range of motion.  Pulmonary/Chest: Effort normal. No respiratory distress.  Neurological: He is alert and oriented to person, place, and time.  Psychiatric: Affect normal.    CBC No results found for: WBC, RBC, HGB, HCT, PLT, MCV, MCH, MCHC, RDW, LYMPHSABS, MONOABS, EOSABS, BASOSABS  CMP     Component Value Date/Time   NA 140 12/02/2017 0277  K 4.1 12/02/2017 0821   CL 108 12/02/2017 0821   CO2 27 12/02/2017 0821   GLUCOSE 91 12/02/2017 0821   BUN 21 12/02/2017 0821   CREATININE 1.34 12/02/2017 0821   CALCIUM 9.3 12/02/2017 0821   PROT 6.9 12/02/2017 0821   ALBUMIN 4.3 12/02/2017 0821   AST 26 12/02/2017 0821   ALT 56 (H) 12/02/2017 0821   ALKPHOS 57 12/02/2017 0821   BILITOT 0.4 12/02/2017 0821   GFRNONAA 61.81 08/04/2010 0948    02/22/2019 hepatitis B and C panel negative. 02/22/2019, PSA 1.02  Assessment and Plan: 1. Elevated ferritin level     Discussed with patient that elevated ferritin can be due to infection, inflammation, hemachromatosis, etc. recommend to repeat iron TIBC ferritin panel Recommend check hemochromatosis mutation screening test.   Diagnosis of hereditary hemochromatosis discussed with patient.  Advised patient to have first-degree relative screening for hemochromatosis.  Avoid alcohol consumption.  Avoid uncooked seafood. Patient voices understanding.  Orders Placed This Encounter  Procedures  . Iron and TIBC    Standing Status:   Future    Standing Expiration Date:   04/02/2020  . Ferritin    Standing Status:   Future    Standing Expiration Date:   04/02/2020  . Hemochromatosis DNA-PCR(c282y,h63d)    Standing Status:   Future    Standing Expiration Date:   04/02/2020    Follow Up Instructions: 3 weeks to  discuss work up results.   I discussed the assessment and treatment plan with the patient. The patient was provided an opportunity to ask questions and all were answered. The patient agreed with the plan and demonstrated an understanding of the instructions.  The patient was advised to call back or seek an in-person evaluation if the symptoms worsen or if the condition fails to improve as anticipated.  Cc.Dr.Anderson  Earlie Server, MD 04/03/2019 1:12 PM

## 2019-04-03 NOTE — Progress Notes (Signed)
Called patient for televisit via [employer] for a new patient consult.

## 2019-04-06 ENCOUNTER — Inpatient Hospital Stay: Payer: Managed Care, Other (non HMO)

## 2019-04-10 ENCOUNTER — Other Ambulatory Visit: Payer: Self-pay

## 2019-04-11 ENCOUNTER — Other Ambulatory Visit: Payer: Self-pay

## 2019-04-11 ENCOUNTER — Inpatient Hospital Stay: Payer: Managed Care, Other (non HMO) | Attending: Oncology

## 2019-04-11 DIAGNOSIS — R7989 Other specified abnormal findings of blood chemistry: Secondary | ICD-10-CM | POA: Diagnosis not present

## 2019-04-11 DIAGNOSIS — Z79899 Other long term (current) drug therapy: Secondary | ICD-10-CM | POA: Diagnosis not present

## 2019-04-11 DIAGNOSIS — Z87891 Personal history of nicotine dependence: Secondary | ICD-10-CM | POA: Diagnosis not present

## 2019-04-11 LAB — IRON AND TIBC
Iron: 95 ug/dL (ref 45–182)
Saturation Ratios: 33 % (ref 17.9–39.5)
TIBC: 288 ug/dL (ref 250–450)
UIBC: 193 ug/dL

## 2019-04-11 LAB — FERRITIN: Ferritin: 632 ng/mL — ABNORMAL HIGH (ref 24–336)

## 2019-04-17 LAB — HEMOCHROMATOSIS DNA-PCR(C282Y,H63D)

## 2019-04-25 ENCOUNTER — Telehealth: Payer: Self-pay | Admitting: Oncology

## 2019-04-26 ENCOUNTER — Encounter: Payer: Self-pay | Admitting: Oncology

## 2019-04-26 ENCOUNTER — Other Ambulatory Visit: Payer: Self-pay

## 2019-04-26 ENCOUNTER — Inpatient Hospital Stay (HOSPITAL_BASED_OUTPATIENT_CLINIC_OR_DEPARTMENT_OTHER): Payer: Managed Care, Other (non HMO) | Admitting: Oncology

## 2019-04-26 DIAGNOSIS — Z87891 Personal history of nicotine dependence: Secondary | ICD-10-CM

## 2019-04-26 DIAGNOSIS — R7989 Other specified abnormal findings of blood chemistry: Secondary | ICD-10-CM

## 2019-04-26 DIAGNOSIS — Z79899 Other long term (current) drug therapy: Secondary | ICD-10-CM | POA: Diagnosis not present

## 2019-04-26 NOTE — Progress Notes (Signed)
Patient contacted for telehealth viist. No concerns voiced.

## 2019-04-29 NOTE — Progress Notes (Signed)
HEMATOLOGY-ONCOLOGY TeleHEALTH VISIT PROGRESS NOTE  I connected with [name] on 04/29/19 at  1:30 PM EDT by video enabled telemedicine visit and verified that I am speaking with the correct person using two identifiers. I discussed the limitations, risks, security and privacy concerns of performing an evaluation and management service by telemedicine and the availability of in-person appointments. I also discussed with the patient that there may be a patient responsible charge related to this service. The patient expressed understanding and agreed to proceed.   Other persons participating in the visit and their role in the encounter:  Geraldine Solar, [place], check in patient   Janeann Merl, RN, check in patient.   Patient's location: work Provider's location: work Risk analyst Complaint: Follow-up for discussion of lab work-up for elevated ferritin.   INTERVAL HISTORY Kyle Velasquez is a 58 y.o. male who has above history reviewed by me today presents for follow up visit for management of elevated ferritin. Problems and complaints are listed below:  Patient had lab work-up done and present for discussion results and management plan. He has no new complaints. Review of Systems  Constitutional: Positive for fatigue. Negative for appetite change, chills, fever and unexpected weight change.  HENT:   Negative for hearing loss and voice change.   Eyes: Negative for eye problems and icterus.  Respiratory: Negative for chest tightness, cough and shortness of breath.   Cardiovascular: Negative for chest pain and leg swelling.  Gastrointestinal: Negative for abdominal distention and abdominal pain.  Endocrine: Negative for hot flashes.  Genitourinary: Negative for difficulty urinating, dysuria and frequency.   Musculoskeletal: Negative for arthralgias.  Skin: Negative for itching and rash.  Neurological: Negative for light-headedness and numbness.  Hematological: Negative for adenopathy. Does  not bruise/bleed easily.  Psychiatric/Behavioral: Negative for confusion.    Past Medical History:  Diagnosis Date  . Dyslipidemia 06/22/2012  . Epiretinal membrane (ERM) of left eye 02/2019  . Hyperlipidemia   . Seizures ([place])    LAST HAD SEIZURE IN 1981   Past Surgical History:  Procedure Laterality Date  . COLONOSCOPY  08/2012   WNL, sm int hemorrhoids, rec rpt 10 yrs ([facility])  . EXCISION OF SKIN TAG N/A 09/09/2015   Procedure: EXCISION OF SKIN TAG/ANAL SKIN TAGS;  Surgeon: Robert Bellow, MD;  Location: [facility] ORS;  Service: General;  Laterality: N/A;  . VASECTOMY  2010   Dr. Purvis Kilts [facility])    Family History  Problem Relation Age of Onset  . Hypertension Mother   . Hyperlipidemia Mother   . Other Father        Hypoglycemia  . HIV/AIDS Father        from blood transfusion   . Diabetes Maternal Grandfather   . Heart failure Maternal Grandmother   . Diabetes Paternal Grandfather   . Diabetes Paternal Grandmother   . Breast cancer Sister   . Breast cancer Maternal Aunt   . Coronary artery disease Paternal Uncle        MI  . Prostate cancer Maternal Uncle   . Breast cancer Paternal Aunt   . Stroke Neg Hx   . Colon cancer Neg Hx   . Stomach cancer Neg Hx     Social History   Socioeconomic History  . Marital status: Married    Spouse name: Not on file  . Number of children: 2  . Years of education: Not on file  . Highest education level: Not on file  Occupational History  . Occupation: Sales promotion account executive  Social Needs  . Financial resource strain: Not on file  . Food insecurity:    Worry: Not on file    Inability: Not on file  . Transportation needs:    Medical: Not on file    Non-medical: Not on file  Tobacco Use  . Smoking status: Former Smoker    Packs/day: 1.00    Years: 5.00    Pack years: 5.00    Types: Cigarettes    Last attempt to quit: 12/07/1986    Years since quitting: 32.4  . Smokeless tobacco: Never Used  Substance and Sexual Activity  .  Alcohol use: Yes    Comment: Occasional  . Drug use: No  . Sexual activity: Not on file  Lifestyle  . Physical activity:    Days per week: Not on file    Minutes per session: Not on file  . Stress: Not on file  Relationships  . Social connections:    Talks on phone: Not on file    Gets together: Not on file    Attends religious service: Not on file    Active member of club or organization: Not on file    Attends meetings of clubs or organizations: Not on file    Relationship status: Not on file  . Intimate partner violence:    Fear of current or ex partner: Not on file    Emotionally abused: Not on file    Physically abused: Not on file    Forced sexual activity: Not on file  Other Topics Concern  . Not on file  Social History Narrative   Caffeine use/day: 2-3 cups coffee/day    Lives with wife, 2 daughters, 1 dog.    Occupation: Network engineer    Exercise - basketball weekly, walking 2 miles a day    Diet: some water, fruits/vegetables occasionally, red meat 1x/wk, fish rarely    Current Outpatient Medications on File Prior to Visit  Medication Sig Dispense Refill  . atorvastatin (LIPITOR) 20 MG tablet Take 20 mg by mouth daily.    . Coenzyme Q10 (CO Q 10) 100 MG CAPS Take 2 tablets by mouth daily.    Marland Kitchen loratadine-pseudoephedrine (CLARITIN-D 24-HOUR) 10-240 MG per 24 hr tablet Take 1 tablet by mouth as needed.     . Omega-3 Fatty Acids (OMEGA 3 500 PO) Take by mouth.     No current facility-administered medications on file prior to visit.     No Known Allergies     Observations/Objective: There were no vitals filed for this visit. There is no height or weight on file to calculate BMI.  Physical Exam  Constitutional: He is oriented to person, place, and time. No distress.  HENT:  Head: Atraumatic.  Eyes: Pupils are equal, round, and reactive to light.  Pulmonary/Chest: Effort normal.  Neurological: He is oriented to person, place, and time.   Psychiatric: Affect normal.    CBC No results found for: WBC, RBC, HGB, HCT, PLT, MCV, MCH, MCHC, RDW, LYMPHSABS, MONOABS, EOSABS, BASOSABS  CMP     Component Value Date/Time   NA 140 12/02/2017 0821   K 4.1 12/02/2017 0821   CL 108 12/02/2017 0821   CO2 27 12/02/2017 0821   GLUCOSE 91 12/02/2017 0821   BUN 21 12/02/2017 0821   CREATININE 1.34 12/02/2017 0821   CALCIUM 9.3 12/02/2017 0821   PROT 6.9 12/02/2017 0821   ALBUMIN 4.3 12/02/2017 0821   AST 26 12/02/2017 0821   ALT 56 (H) 12/02/2017  0045   TXHFSFS 23 12/02/2017 0821   BILITOT 0.4 12/02/2017 0821   GFRNONAA 61.81 08/04/2010 0948     Assessment and Plan: 1. Hereditary hemochromatosis ([place])   2. Elevated ferritin level     Was also evaluated by labs are reviewed and discussed with the patient.  Patient had a single mutation H63D mutation. Patient has heterozygous hemochromatosis.  Diagnosis of hereditary hemochromatosis heterozygous discussed with patient.  Advised patient to have first-degree relative screening for hemochromatosis.  Avoid alcohol consumption.  Avoid uncooked seafood. Patient voices understanding.   Patient's ferritin is above 500, iron saturation is at 33. Gust with patient about options of diet control or starting phlebotomy program.  Patient want to proceed with diet control first and then monitor lab counts. Recommend checking abdominal ultrasound.  Follow Up Instructions: Lab MD 3 months.  I discussed the assessment and treatment plan with the patient. The patient was provided an opportunity to ask questions and all were answered. The patient agreed with the plan and demonstrated an understanding of the instructions.  The patient was advised to call back or seek an in-person evaluation if the symptoms worsen or if the condition fails to improve as anticipated.    Earlie Server, MD 04/29/2019 5:47 PM

## 2019-05-05 ENCOUNTER — Ambulatory Visit
Admission: RE | Admit: 2019-05-05 | Discharge: 2019-05-05 | Disposition: A | Discharge status: Home / Self Care | Payer: Managed Care, Other (non HMO) | Source: Ambulatory Visit | Attending: Oncology | Admitting: Oncology

## 2019-05-05 ENCOUNTER — Other Ambulatory Visit: Payer: Self-pay

## 2019-07-21 ENCOUNTER — Other Ambulatory Visit: Payer: Self-pay

## 2019-07-24 ENCOUNTER — Other Ambulatory Visit: Payer: Self-pay

## 2019-07-24 ENCOUNTER — Inpatient Hospital Stay: Payer: Managed Care, Other (non HMO) | Attending: Oncology

## 2019-07-24 DIAGNOSIS — R7989 Other specified abnormal findings of blood chemistry: Secondary | ICD-10-CM | POA: Insufficient documentation

## 2019-07-24 LAB — CBC WITH DIFFERENTIAL/PLATELET
Abs Immature Granulocytes: 0.03 10*3/uL (ref 0.00–0.07)
Basophils Absolute: 0 10*3/uL (ref 0.0–0.1)
Basophils Relative: 1 %
Eosinophils Absolute: 0.1 10*3/uL (ref 0.0–0.5)
Eosinophils Relative: 2 %
HCT: 44.3 % (ref 39.0–52.0)
Hemoglobin: 14.7 g/dL (ref 13.0–17.0)
Immature Granulocytes: 1 %
Lymphocytes Relative: 36 %
Lymphs Abs: 2 10*3/uL (ref 0.7–4.0)
MCH: 29.6 pg (ref 26.0–34.0)
MCHC: 33.2 g/dL (ref 30.0–36.0)
MCV: 89.1 fL (ref 80.0–100.0)
Monocytes Absolute: 0.4 10*3/uL (ref 0.1–1.0)
Monocytes Relative: 7 %
Neutro Abs: 3 10*3/uL (ref 1.7–7.7)
Neutrophils Relative %: 53 %
Platelets: 168 10*3/uL (ref 150–400)
RBC: 4.97 MIL/uL (ref 4.22–5.81)
RDW: 12.6 % (ref 11.5–15.5)
WBC: 5.6 10*3/uL (ref 4.0–10.5)
nRBC: 0 % (ref 0.0–0.2)

## 2019-07-24 LAB — COMPREHENSIVE METABOLIC PANEL
ALT: 40 U/L (ref 0–44)
AST: 23 U/L (ref 15–41)
Albumin: 4.2 g/dL (ref 3.5–5.0)
Alkaline Phosphatase: 62 U/L (ref 38–126)
Anion gap: 8 (ref 5–15)
BUN: 21 mg/dL — ABNORMAL HIGH (ref 6–20)
CO2: 25 mmol/L (ref 22–32)
Calcium: 9.2 mg/dL (ref 8.9–10.3)
Chloride: 107 mmol/L (ref 98–111)
Creatinine, Ser: 1.34 mg/dL — ABNORMAL HIGH (ref 0.61–1.24)
GFR calc Af Amer: >60 mL/min (ref >60)
GFR calc non Af Amer: 58 mL/min — ABNORMAL LOW (ref >60)
Glucose, Bld: 98 mg/dL (ref 70–99)
Potassium: 4.1 mmol/L (ref 3.5–5.1)
Sodium: 140 mmol/L (ref 135–145)
Total Bilirubin: 0.6 mg/dL (ref 0.3–1.2)
Total Protein: 7.2 g/dL (ref 6.5–8.1)

## 2019-07-24 LAB — IRON AND TIBC
Iron: 114 ug/dL (ref 45–182)
Saturation Ratios: 39 % (ref 17.9–39.5)
TIBC: 294 ug/dL (ref 250–450)
UIBC: 180 ug/dL

## 2019-07-24 LAB — FERRITIN: Ferritin: 516 ng/mL — ABNORMAL HIGH (ref 24–336)

## 2019-07-26 ENCOUNTER — Inpatient Hospital Stay: Payer: Managed Care, Other (non HMO)

## 2019-07-26 ENCOUNTER — Other Ambulatory Visit: Payer: Self-pay

## 2019-07-26 ENCOUNTER — Encounter: Payer: Self-pay | Admitting: Oncology

## 2019-07-26 ENCOUNTER — Inpatient Hospital Stay (HOSPITAL_BASED_OUTPATIENT_CLINIC_OR_DEPARTMENT_OTHER): Payer: Managed Care, Other (non HMO) | Admitting: Oncology

## 2019-07-26 VITALS — BP 134/88 | HR 66 | Temp 96.6°F | Resp 18 | Wt 190.0 lb

## 2019-07-26 DIAGNOSIS — Z148 Genetic carrier of other disease: Secondary | ICD-10-CM | POA: Diagnosis not present

## 2019-07-26 HISTORY — DX: Other disorders of iron metabolism: E83.19

## 2019-07-26 NOTE — Progress Notes (Signed)
Patient is asking if it would be safe for him to take Co-Q10.

## 2019-07-26 NOTE — Progress Notes (Signed)
Hematology/Oncology Follow Up Note [facility]  [phone] [phone]  Patient Care Team: Kirk Ruths, MD as PCP - General (Internal Medicine) Bary Castilla Forest Gleason, MD as Consulting Physician (General Surgery) Ria Bush, MD as Referring Physician (Family Medicine)   Name of the patient: Kyle Velasquez  [id number]  1961-02-20   REASON FOR VISIT  follow-up for hemochromatosis/iron overload management.  PERTINENT HEMATOLOGY HISTORY Kyle Velasquez is a 58 y.o. male who has above history reviewed by me today presents for initial consultation for the evaluation of elevated ferritin  Patient follows up with Dr. Ouida Sills PCP and recently had labs done.  He has normal liver function.  Ferritin level was elevated at 628 on 02/22/2019. he denies any family history of hemochromatosis or anyone with condition needs frequent blood removal. He also denies any fatigue, joint pain, abdominal pain or leg swelling. Denies alcohol use Patient had colonoscopy done on 08/27/2019 which showed normal colon and a small internal hemorrhoids.  INTERVAL HISTORY Kyle Velasquez is a 58 y.o. male who has above history reviewed by me today presents for follow up visit for management of heterozygous hemochromatosis/iron overload. Problems and complaints are listed below: Patient reports feeling well today.  Patient has previously decided to for observation and diet control.  Informs me that he has been very careful with his diet, he checks labels about iron contact often.  Eats red meat once a week. Denies any joint swelling or pain.  Chronic fatigue at baseline.  Review of Systems  Constitutional: Positive for fatigue. Negative for appetite change, chills, fever and unexpected weight change.  HENT:   Negative for hearing loss and voice change.   Eyes: Negative for eye problems and icterus.  Respiratory: Negative for chest tightness, cough and shortness  of breath.   Cardiovascular: Negative for chest pain and leg swelling.  Gastrointestinal: Negative for abdominal distention and abdominal pain.  Endocrine: Negative for hot flashes.  Genitourinary: Negative for difficulty urinating, dysuria and frequency.   Musculoskeletal: Negative for arthralgias.  Skin: Negative for itching and rash.  Neurological: Negative for light-headedness and numbness.  Hematological: Negative for adenopathy. Does not bruise/bleed easily.  Psychiatric/Behavioral: Negative for confusion.      No Known Allergies   Past Medical History:  Diagnosis Date  . Dyslipidemia 06/22/2012  . Epiretinal membrane (ERM) of left eye 02/2019  . Hyperlipidemia   . Iron overload 07/26/2019  . Seizures ([place])    LAST HAD SEIZURE IN 1981     Past Surgical History:  Procedure Laterality Date  . COLONOSCOPY  08/2012   WNL, sm int hemorrhoids, rec rpt 10 yrs (Pyrtle)  . EXCISION OF SKIN TAG N/A 09/09/2015   Procedure: EXCISION OF SKIN TAG/ANAL SKIN TAGS;  Surgeon: Robert Bellow, MD;  Location: [facility] ORS;  Service: General;  Laterality: N/A;  . VASECTOMY  2010   Dr. Purvis Kilts [facility])    Social History   Socioeconomic History  . Marital status: Married    Spouse name: Not on file  . Number of children: 2  . Years of education: Not on file  . Highest education level: Not on file  Occupational History  . Occupation: Sales promotion account executive  Social Needs  . Financial resource strain: Not on file  . Food insecurity    Worry: Not on file    Inability: Not on file  . Transportation needs    Medical: Not on file    Non-medical: Not on file  Tobacco Use  .  Smoking status: Former Smoker    Packs/day: 1.00    Years: 5.00    Pack years: 5.00    Types: Cigarettes    Quit date: 12/07/1986    Years since quitting: 32.6  . Smokeless tobacco: Never Used  Substance and Sexual Activity  . Alcohol use: Yes    Comment: Occasional  . Drug use: No  . Sexual activity: Not on file   Lifestyle  . Physical activity    Days per week: Not on file    Minutes per session: Not on file  . Stress: Not on file  Relationships  . Social Herbalist on phone: Not on file    Gets together: Not on file    Attends religious service: Not on file    Active member of club or organization: Not on file    Attends meetings of clubs or organizations: Not on file    Relationship status: Not on file  . Intimate partner violence    Fear of current or ex partner: Not on file    Emotionally abused: Not on file    Physically abused: Not on file    Forced sexual activity: Not on file  Other Topics Concern  . Not on file  Social History Narrative   Caffeine use/day: 2-3 cups coffee/day    Lives with wife, 2 daughters, 1 dog.    Occupation: Network engineer    Exercise - basketball weekly, walking 2 miles a day    Diet: some water, fruits/vegetables occasionally, red meat 1x/wk, fish rarely    Family History  Problem Relation Age of Onset  . Hypertension Mother   . Hyperlipidemia Mother   . Other Father        Hypoglycemia  . HIV/AIDS Father        from blood transfusion   . Diabetes Maternal Grandfather   . Heart failure Maternal Grandmother   . Diabetes Paternal Grandfather   . Diabetes Paternal Grandmother   . Breast cancer Sister   . Breast cancer Maternal Aunt   . Coronary artery disease Paternal Uncle        MI  . Prostate cancer Maternal Uncle   . Breast cancer Paternal Aunt   . Stroke Neg Hx   . Colon cancer Neg Hx   . Stomach cancer Neg Hx      Current Outpatient Medications:  .  atorvastatin (LIPITOR) 20 MG tablet, Take 20 mg by mouth daily., Disp: , Rfl:  .  Coenzyme Q10 (CO Q 10) 100 MG CAPS, Take 2 tablets by mouth daily., Disp: , Rfl:  .  loratadine-pseudoephedrine (CLARITIN-D 24-HOUR) 10-240 MG per 24 hr tablet, Take 1 tablet by mouth as needed. , Disp: , Rfl:  .  Omega-3 Fatty Acids (OMEGA 3 500 PO), Take by mouth., Disp: , Rfl:    Physical exam:  Vitals:   07/26/19 1310  BP: 134/88  Pulse: 66  Resp: 18  Temp: (!) 96.6 F (35.9 C)  Weight: 190 lb (86.2 kg)   Physical Exam Constitutional:      General: He is not in acute distress. HENT:     Head: Normocephalic and atraumatic.  Eyes:     General: No scleral icterus.    Pupils: Pupils are equal, round, and reactive to light.  Neck:     Musculoskeletal: Normal range of motion and neck supple.  Cardiovascular:     Rate and Rhythm: Normal rate and regular rhythm.  Heart sounds: Normal heart sounds.  Pulmonary:     Effort: Pulmonary effort is normal. No respiratory distress.     Breath sounds: No wheezing.  Abdominal:     General: Bowel sounds are normal. There is no distension.     Palpations: Abdomen is soft. There is no mass.     Tenderness: There is no abdominal tenderness.  Musculoskeletal: Normal range of motion.        General: No deformity.  Skin:    General: Skin is warm and dry.     Findings: No erythema or rash.  Neurological:     Mental Status: He is alert and oriented to person, place, and time.     Cranial Nerves: No cranial nerve deficit.     Coordination: Coordination normal.  Psychiatric:        Behavior: Behavior normal.        Thought Content: Thought content normal.     CMP Latest Ref Rng & Units 07/24/2019  Glucose 70 - 99 mg/dL 98  BUN 6 - 20 mg/dL 21(H)  Creatinine 0.61 - 1.24 mg/dL 1.34(H)  Sodium 135 - 145 mmol/L 140  Potassium 3.5 - 5.1 mmol/L 4.1  Chloride 98 - 111 mmol/L 107  CO2 22 - 32 mmol/L 25  Calcium 8.9 - 10.3 mg/dL 9.2  Total Protein 6.5 - 8.1 g/dL 7.2  Total Bilirubin 0.3 - 1.2 mg/dL 0.6  Alkaline Phos 38 - 126 U/L 62  AST 15 - 41 U/L 23  ALT 0 - 44 U/L 40   CBC Latest Ref Rng & Units 07/24/2019  WBC 4.0 - 10.5 K/uL 5.6  Hemoglobin 13.0 - 17.0 g/dL 14.7  Hematocrit 39.0 - 52.0 % 44.3  Platelets 150 - 400 K/uL 168    RADIOGRAPHIC STUDIES: I have personally reviewed the radiological images as  listed and agreed with the findings in the report. US Spleen (abdomen Limited)  Result Date: 05/05/2019 CLINICAL DATA:  Hereditary hemochromatosis, elevated ferritin levels, currently asymptomatic EXAM: ULTRASOUND ABDOMEN LIMITED COMPARISON:  None. FINDINGS: Spleen is homogeneous in echotexture without focal lesion, measuring 6.7 x 10.1 x 4.1 cm (volume = 150 cm^3). IMPRESSION: Negative Electronically Signed   By: Lucrezia Europe M.D.   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 Iron overload    Heterozygous H63D mutation, Discussed with the patient that usually heterozygous hemochromatosis are asymptomatic. Labs are reviewed and discussed with patient. His ferritin levels are still above 500, with iron saturation at 39. Both observation or proceed with phlebotomy are reasonable options for him. We will also obtain ultrasound-based hepatic elastography Avoid alcohol. Recommend first-degree relatives be screened for hemochromatosis mutation genes. Agrees with plan for 1 phlebotomy 500 cc today. Follow-up in 3 months repeat blood work.  Orders Placed This Encounter  Procedures  . [place] ELASTOGRAPHY LIVER    Standing Status:   Future    Standing Expiration Date:   09/24/2020    Order Specific Question:   Reason for Exam (SYMPTOM  OR DIAGNOSIS REQUIRED)    Answer:   hemachromtosis carrier. high ferritin    Order Specific Question:   Preferred imaging location?    Answer:   [facility]  . CBC with Differential/Platelet    Standing Status:   Future    Standing Expiration Date:   07/25/2020  . Ferritin    Standing Status:   Future    Standing Expiration Date:   07/25/2020  . Iron  and TIBC    Standing Status:   Future    Standing Expiration Date:   07/25/2020  . Comprehensive metabolic panel    Standing Status:   Future    Standing Expiration Date:   07/25/2020    Follow-up in 3 months.   Earlie Server, MD, PhD Hematology Oncology [facility] Pager- [phone] 07/26/2019

## 2019-08-04 ENCOUNTER — Ambulatory Visit
Admission: RE | Admit: 2019-08-04 | Discharge: 2019-08-04 | Disposition: A | Discharge status: Home / Self Care | Payer: Managed Care, Other (non HMO) | Source: Ambulatory Visit | Attending: Oncology | Admitting: Oncology

## 2019-08-04 ENCOUNTER — Other Ambulatory Visit: Payer: Self-pay

## 2019-08-04 DIAGNOSIS — Z148 Genetic carrier of other disease: Secondary | ICD-10-CM | POA: Insufficient documentation

## 2019-10-13 ENCOUNTER — Other Ambulatory Visit: Payer: Self-pay

## 2019-10-13 ENCOUNTER — Inpatient Hospital Stay: Payer: Managed Care, Other (non HMO) | Attending: Oncology

## 2019-10-13 DIAGNOSIS — Z148 Genetic carrier of other disease: Secondary | ICD-10-CM

## 2019-10-13 LAB — CBC WITH DIFFERENTIAL/PLATELET
Abs Immature Granulocytes: 0.04 10*3/uL (ref 0.00–0.07)
Basophils Absolute: 0 10*3/uL (ref 0.0–0.1)
Basophils Relative: 1 %
Eosinophils Absolute: 0.2 10*3/uL (ref 0.0–0.5)
Eosinophils Relative: 3 %
HCT: 42.3 % (ref 39.0–52.0)
Hemoglobin: 13.9 g/dL (ref 13.0–17.0)
Immature Granulocytes: 1 %
Lymphocytes Relative: 39 %
Lymphs Abs: 2.1 10*3/uL (ref 0.7–4.0)
MCH: 29.6 pg (ref 26.0–34.0)
MCHC: 32.9 g/dL (ref 30.0–36.0)
MCV: 90 fL (ref 80.0–100.0)
Monocytes Absolute: 0.6 10*3/uL (ref 0.1–1.0)
Monocytes Relative: 10 %
Neutro Abs: 2.6 10*3/uL (ref 1.7–7.7)
Neutrophils Relative %: 46 %
Platelets: 174 10*3/uL (ref 150–400)
RBC: 4.7 MIL/uL (ref 4.22–5.81)
RDW: 12.3 % (ref 11.5–15.5)
WBC: 5.5 10*3/uL (ref 4.0–10.5)
nRBC: 0 % (ref 0.0–0.2)

## 2019-10-13 LAB — COMPREHENSIVE METABOLIC PANEL
ALT: 28 U/L (ref 0–44)
AST: 19 U/L (ref 15–41)
Albumin: 3.9 g/dL (ref 3.5–5.0)
Alkaline Phosphatase: 57 U/L (ref 38–126)
Anion gap: 6 (ref 5–15)
BUN: 21 mg/dL — ABNORMAL HIGH (ref 6–20)
CO2: 22 mmol/L (ref 22–32)
Calcium: 8.9 mg/dL (ref 8.9–10.3)
Chloride: 107 mmol/L (ref 98–111)
Creatinine, Ser: 1.1 mg/dL (ref 0.61–1.24)
GFR calc Af Amer: >60 mL/min (ref >60)
GFR calc non Af Amer: >60 mL/min (ref >60)
Glucose, Bld: 97 mg/dL (ref 70–99)
Potassium: 4 mmol/L (ref 3.5–5.1)
Sodium: 135 mmol/L (ref 135–145)
Total Bilirubin: 0.7 mg/dL (ref 0.3–1.2)
Total Protein: 6.7 g/dL (ref 6.5–8.1)

## 2019-10-13 LAB — IRON AND TIBC
Iron: 127 ug/dL (ref 45–182)
Saturation Ratios: 45 % — ABNORMAL HIGH (ref 17.9–39.5)
TIBC: 284 ug/dL (ref 250–450)
UIBC: 157 ug/dL

## 2019-10-13 LAB — FERRITIN: Ferritin: 330 ng/mL (ref 24–336)

## 2019-10-23 ENCOUNTER — Other Ambulatory Visit: Payer: Managed Care, Other (non HMO)

## 2019-10-24 ENCOUNTER — Other Ambulatory Visit: Payer: Self-pay

## 2019-10-24 ENCOUNTER — Inpatient Hospital Stay (HOSPITAL_BASED_OUTPATIENT_CLINIC_OR_DEPARTMENT_OTHER): Payer: Managed Care, Other (non HMO) | Admitting: Oncology

## 2019-10-24 ENCOUNTER — Encounter: Payer: Self-pay | Admitting: Oncology

## 2019-10-24 ENCOUNTER — Inpatient Hospital Stay: Payer: Managed Care, Other (non HMO)

## 2019-10-24 DIAGNOSIS — K824 Cholesterolosis of gallbladder: Secondary | ICD-10-CM | POA: Diagnosis not present

## 2019-10-24 DIAGNOSIS — Z148 Genetic carrier of other disease: Secondary | ICD-10-CM | POA: Diagnosis not present

## 2019-10-24 NOTE — Progress Notes (Signed)
Patient does not offer any problems today.  

## 2019-10-24 NOTE — Progress Notes (Signed)
Pt had 500cc of blood removed via therapeutic phlebotomy per order. Pt tolerated well. VSS on discharge.

## 2019-10-24 NOTE — Progress Notes (Signed)
Hematology/Oncology Follow Up Note [facility]  [phone] [phone]  Patient Care Team: Kirk Ruths, MD as PCP - General (Internal Medicine) Bary Castilla Forest Gleason, MD as Consulting Physician (General Surgery) Ria Bush, MD as Referring Physician (Family Medicine)   Name of the patient: Kyle Brey  MD:[id number]  [birth year]   REASON FOR VISIT  follow-up for hemochromatosis/iron overload management.  PERTINENT HEMATOLOGY HISTORY Kyle Velasquez is a 58 y.o. male who has above history reviewed by me today presents for initial consultation for the evaluation of elevated ferritin  Patient follows up with Dr. Ouida Sills PCP and recently had labs done.  He has normal liver function.  Ferritin level was elevated at 628 on 02/22/2019. he denies any family history of hemochromatosis or anyone with condition needs frequent blood removal. He also denies any fatigue, joint pain, abdominal pain or leg swelling. Denies alcohol use Patient had colonoscopy done on 08/27/2019 which showed normal colon and a small internal hemorrhoids.  INTERVAL HISTORY Kyle Velasquez is a 58 y.o. male who has above history reviewed by me today presents for follow up visit for management of heterozygous hemochromatosis/iron overload. Problems and complaints are listed below: Patient reports feeling well today. He had a phlebotomy at last visit.  No side effects. Mild fatigue.  Stable. No new complaints. .  Review of Systems  Constitutional: Positive for fatigue. Negative for appetite change, chills, fever and unexpected weight change.  HENT:   Negative for hearing loss and voice change.   Eyes: Negative for eye problems and icterus.  Respiratory: Negative for chest tightness, cough and shortness of breath.   Cardiovascular: Negative for chest pain and leg swelling.  Gastrointestinal: Negative for abdominal distention and abdominal pain.  Endocrine:  Negative for hot flashes.  Genitourinary: Negative for difficulty urinating, dysuria and frequency.   Musculoskeletal: Negative for arthralgias.  Skin: Negative for itching and rash.  Neurological: Negative for light-headedness and numbness.  Hematological: Negative for adenopathy. Does not bruise/bleed easily.  Psychiatric/Behavioral: Negative for confusion.      No Known Allergies   Past Medical History:  Diagnosis Date  . Dyslipidemia 06/22/2012  . Epiretinal membrane (ERM) of left eye 02/2019  . Hyperlipidemia   . Iron overload 07/26/2019  . Seizures ([place])    LAST HAD SEIZURE IN 1981     Past Surgical History:  Procedure Laterality Date  . COLONOSCOPY  08/2012   WNL, sm int hemorrhoids, rec rpt 10 yrs ([facility]rt[facility])  . EXCISION OF SKIN TAG N/A 09/09/2015   Procedure: EXCISION OF SKIN TAG/ANAL SKIN TAGS;  Surgeon: Robert Bellow, MD;  Location: [facility] ORS;  Service: General;  Laterality: N/A;  . VASECTOMY  2010   Dr. Purvis Kilts [facility])    Social History   Socioeconomic History  . Marital status: Married    Spouse name: Not on file  . Number of children: 2  . Years of education: Not on file  . Highest education level: Not on file  Occupational History  . Occupation: Sales promotion account executive  Social Needs  . Financial resource strain: Not on file  . Food insecurity    Worry: Not on file    Inability: Not on file  . Transportation needs    Medical: Not on file    Non-medical: Not on file  Tobacco Use  . Smoking status: Former Smoker    Packs/day: 1.00    Years: 5.00    Pack years: 5.00    Types: Cigarettes  Quit date: 12/07/1986    Years since quitting: 32.9  . Smokeless tobacco: Never Used  Substance and Sexual Activity  . Alcohol use: Yes    Comment: Occasional  . Drug use: No  . Sexual activity: Not on file  Lifestyle  . Physical activity    Days per week: Not on file    Minutes per session: Not on file  . Stress: Not on file  Relationships  . Social Product manager on phone: Not on file    Gets together: Not on file    Attends religious service: Not on file    Active member of club or organization: Not on file    Attends meetings of clubs or organizations: Not on file    Relationship status: Not on file  . Intimate partner violence    Fear of current or ex partner: Not on file    Emotionally abused: Not on file    Physically abused: Not on file    Forced sexual activity: Not on file  Other Topics Concern  . Not on file  Social History Narrative   Caffeine use/day: 2-3 cups coffee/day    Lives with wife, 2 daughters, 1 dog.    Occupation: Network engineer    Exercise - basketball weekly, walking 2 miles a day    Diet: some water, fruits/vegetables occasionally, red meat 1x/wk, fish rarely    Family History  Problem Relation Age of Onset  . Hypertension Mother   . Hyperlipidemia Mother   . Other Father        Hypoglycemia  . HIV/AIDS Father        from blood transfusion   . Diabetes Maternal Grandfather   . Heart failure Maternal Grandmother   . Diabetes Paternal Grandfather   . Diabetes Paternal Grandmother   . Breast cancer Sister   . Breast cancer Maternal Aunt   . Coronary artery disease Paternal Uncle        MI  . Prostate cancer Maternal Uncle   . Breast cancer Paternal Aunt   . Stroke Neg Hx   . Colon cancer Neg Hx   . Stomach cancer Neg Hx      Current Outpatient Medications:  .  atorvastatin (LIPITOR) 20 MG tablet, Take 20 mg by mouth daily., Disp: , Rfl:  .  loratadine-pseudoephedrine (CLARITIN-D 24-HOUR) 10-240 MG per 24 hr tablet, Take 1 tablet by mouth as needed. , Disp: , Rfl:  .  Coenzyme Q10 (CO Q 10) 100 MG CAPS, Take 2 tablets by mouth daily., Disp: , Rfl:  .  Omega-3 Fatty Acids (OMEGA 3 500 PO), Take by mouth., Disp: , Rfl:   Physical exam:  Vitals:   10/24/19 1359  BP: (!) 144/84  Pulse: 62  Resp: 18  Temp: (!) 96.8 F (36 C)  Weight: 190 lb 12.8 oz (86.5 kg)   Physical Exam  Constitutional:      General: He is not in acute distress. HENT:     Head: Normocephalic and atraumatic.  Eyes:     General: No scleral icterus.    Pupils: Pupils are equal, round, and reactive to light.  Neck:     Musculoskeletal: Normal range of motion and neck supple.  Cardiovascular:     Rate and Rhythm: Normal rate and regular rhythm.     Heart sounds: Normal heart sounds.  Pulmonary:     Effort: Pulmonary effort is normal. No respiratory distress.     Breath  sounds: No wheezing.  Abdominal:     General: Bowel sounds are normal. There is no distension.     Palpations: Abdomen is soft. There is no mass.     Tenderness: There is no abdominal tenderness.  Musculoskeletal: Normal range of motion.        General: No deformity.  Skin:    General: Skin is warm and dry.     Findings: No erythema or rash.  Neurological:     Mental Status: He is alert and oriented to person, place, and time.     Cranial Nerves: No cranial nerve deficit.     Coordination: Coordination normal.  Psychiatric:        Behavior: Behavior normal.        Thought Content: Thought content normal.     CMP Latest Ref Rng & Units 10/13/2019  Glucose 70 - 99 mg/dL 97  BUN 6 - 20 mg/dL 21(H)  Creatinine 0.61 - 1.24 mg/dL 1.10  Sodium 135 - 145 mmol/L 135  Potassium 3.5 - 5.1 mmol/L 4.0  Chloride 98 - 111 mmol/L 107  CO2 22 - 32 mmol/L 22  Calcium 8.9 - 10.3 mg/dL 8.9  Total Protein 6.5 - 8.1 g/dL 6.7  Total Bilirubin 0.3 - 1.2 mg/dL 0.7  Alkaline Phos 38 - 126 U/L 57  AST 15 - 41 U/L 19  ALT 0 - 44 U/L 28   CBC Latest Ref Rng & Units 10/13/2019  WBC 4.0 - 10.5 K/uL 5.5  Hemoglobin 13.0 - 17.0 g/dL 13.9  Hematocrit 39.0 - 52.0 % 42.3  Platelets 150 - 400 K/uL 174    RADIOGRAPHIC STUDIES: I have personally reviewed the radiological images as listed and agreed with the findings in the report. US Abdomen Ruq W/elastography  Result Date: 08/04/2019 CLINICAL DATA:  Hemochromatosis, high ferritin EXAM:  US ABDOMEN LIMITED - RIGHT UPPER QUADRANT ULTRASOUND HEPATIC ELASTOGRAPHY TECHNIQUE: Limited right upper quadrant abdominal ultrasound was performed. In addition, ultrasound elastography evaluation of the liver was performed. A region of interest was placed in the right lobe of the liver. Following application of a compressive sonographic pulse, shear waves were detected in the adjacent hepatic tissue and the shear wave velocity was calculated. Multiple assessments were performed at the selected site. Median shear wave velocity is correlated to a Metavir fibrosis score. COMPARISON:  05/05/2019 FINDINGS: ULTRASOUND ABDOMEN LIMITED RIGHT UPPER QUADRANT Gallbladder: Echogenic nonmobile focus measuring 5 mm in diameter on image 3/1, compatible with a polyp. No gallbladder wall thickening. Sonographic Percell Miller sign is on docking. Common bile duct: Diameter: 3 mm Liver: No focal lesion identified. Diffuse accentuated echogenicity in the hepatic parenchyma. Portal vein is patent on color Doppler imaging with normal direction of blood flow towards the liver. ULTRASOUND HEPATIC ELASTOGRAPHY Device: Siemens Helix VTQ Patient position: Supine Transducer 5C1 Number of measurements: 10 Hepatic segment:  8 Median velocity:   0.83 m/sec IQR: 0.10 IQR/Median velocity ratio: 0.12 Corresponding Metavir fibrosis score:  F0/F1 Risk of fibrosis: Minimal Limitations of exam: None Please note that abnormal shear wave velocities may also be identified in clinical settings other than with hepatic fibrosis, such as: acute hepatitis, elevated right heart and central venous pressures including use of beta blockers, veno-occlusive disease (Budd-Chiari), infiltrative processes such as mastocytosis/amyloidosis/infiltrative tumor, extrahepatic cholestasis, in the post-prandial state, and liver transplantation. Correlation with patient history, laboratory data, and clinical condition recommended. IMPRESSION: ULTRASOUND ABDOMEN: 1. 5 mm gallbladder  polyp. Given the patient's age, a follow up ultrasound is recommended in 6-12  months time. This recommendation is in compliance with the 2017 joint guidelines between the European Society of Gastrointestinal and Abdominal Radiology (ESGAR), European Association for Endoscopic Surgery and other Interventional Techniques (EAES), International Society of Digestive Surgery - European Federation (EFISDS) and European Society of Gastrointestinal Endoscopy (ESGE). 2. Echogenic liver, nonspecific but possibly from steatosis. ULTRASOUND HEPATIC ELASTOGRAHY: Median hepatic shear wave velocity is calculated at 0.83 m/sec. Corresponding Metavir fibrosis score is F0/F1. Risk of fibrosis is low. Follow-up: Not required for this portion of the exam Electronically Signed   By: Van Clines M.D.   On: 08/04/2019 14:55     Assessment and plan  1. Iron overload   2. Hemochromatosis carrier    Heterozygous H63D mutation, labs are reviewed and discussed with patient. Although ferritin has decreased to 330.  Iron saturation has increased to 45. I recommend proceed with phlebotomy 500 cc today. I will repeat another phlebotomy in 3 weeks. Recommend patient to avoid alcohol  Ultrasound liver elastography was discussed with patient.  No fibrosis. Gallbladder polyps need to be followed up in 6-12 months.. Repeat blood work in 2 months.  Orders Placed This Encounter  Procedures  . CBC with Differential    Standing Status:   Future    Standing Expiration Date:   10/23/2020  . Ferritin    Standing Status:   Future    Standing Expiration Date:   10/23/2020  . Iron and TIBC    Standing Status:   Future    Standing Expiration Date:   10/23/2020  . Hematocrit ([facility])    Standing Status:   Future    Standing Expiration Date:   10/23/2020  . Hemoglobin [facility])    Standing Status:   Future    Standing Expiration Date:   10/23/2020      Earlie Server, MD, PhD Hematology Oncology [facility] at  [facility] Pager- [phone] 10/24/2019

## 2019-11-14 ENCOUNTER — Other Ambulatory Visit: Payer: Self-pay

## 2019-11-14 ENCOUNTER — Inpatient Hospital Stay: Payer: Managed Care, Other (non HMO) | Attending: Oncology

## 2019-11-14 ENCOUNTER — Inpatient Hospital Stay: Payer: Managed Care, Other (non HMO)

## 2019-11-14 ENCOUNTER — Ambulatory Visit: Payer: Managed Care, Other (non HMO) | Admitting: Oncology

## 2019-11-14 DIAGNOSIS — Z148 Genetic carrier of other disease: Secondary | ICD-10-CM | POA: Insufficient documentation

## 2019-11-14 LAB — HEMATOCRIT: HCT: 42.7 % (ref 39.0–52.0)

## 2019-11-14 LAB — HEMOGLOBIN: Hemoglobin: 13.7 g/dL (ref 13.0–17.0)

## 2019-11-14 NOTE — Progress Notes (Signed)
HCT 42.7 HGB 13.7  1454: Therapeutic phlebotomy performed per MD order using a 20 gauge PIV in left AC removing 500cc. Pt declines snack or drink. Pt tolerated procedure well. Pt denies any concerns or complaints at this time. No s/s of distress noted. Pt stable at discharge.

## 2019-12-25 ENCOUNTER — Other Ambulatory Visit: Payer: Self-pay

## 2019-12-25 ENCOUNTER — Inpatient Hospital Stay: Payer: Managed Care, Other (non HMO) | Attending: Oncology

## 2019-12-25 DIAGNOSIS — K824 Cholesterolosis of gallbladder: Secondary | ICD-10-CM | POA: Insufficient documentation

## 2019-12-25 DIAGNOSIS — N6320 Unspecified lump in the left breast, unspecified quadrant: Secondary | ICD-10-CM | POA: Insufficient documentation

## 2019-12-25 DIAGNOSIS — R222 Localized swelling, mass and lump, trunk: Secondary | ICD-10-CM | POA: Insufficient documentation

## 2019-12-25 DIAGNOSIS — Z8042 Family history of malignant neoplasm of prostate: Secondary | ICD-10-CM | POA: Insufficient documentation

## 2019-12-25 DIAGNOSIS — Z803 Family history of malignant neoplasm of breast: Secondary | ICD-10-CM | POA: Diagnosis not present

## 2019-12-25 LAB — IRON AND TIBC
Iron: 102 ug/dL (ref 45–182)
Saturation Ratios: 32 % (ref 17.9–39.5)
TIBC: 321 ug/dL (ref 250–450)
UIBC: 219 ug/dL

## 2019-12-25 LAB — CBC WITH DIFFERENTIAL/PLATELET
Abs Immature Granulocytes: 0.03 10*3/uL (ref 0.00–0.07)
Basophils Absolute: 0 10*3/uL (ref 0.0–0.1)
Basophils Relative: 1 %
Eosinophils Absolute: 0.1 10*3/uL (ref 0.0–0.5)
Eosinophils Relative: 3 %
HCT: 44 % (ref 39.0–52.0)
Hemoglobin: 14.1 g/dL (ref 13.0–17.0)
Immature Granulocytes: 1 %
Lymphocytes Relative: 36 %
Lymphs Abs: 2 10*3/uL (ref 0.7–4.0)
MCH: 29.4 pg (ref 26.0–34.0)
MCHC: 32 g/dL (ref 30.0–36.0)
MCV: 91.7 fL (ref 80.0–100.0)
Monocytes Absolute: 0.5 10*3/uL (ref 0.1–1.0)
Monocytes Relative: 10 %
Neutro Abs: 2.8 10*3/uL (ref 1.7–7.7)
Neutrophils Relative %: 49 %
Platelets: 174 10*3/uL (ref 150–400)
RBC: 4.8 MIL/uL (ref 4.22–5.81)
RDW: 13.1 % (ref 11.5–15.5)
WBC: 5.6 10*3/uL (ref 4.0–10.5)
nRBC: 0 % (ref 0.0–0.2)

## 2019-12-25 LAB — FERRITIN: Ferritin: 174 ng/mL (ref 24–336)

## 2019-12-27 ENCOUNTER — Other Ambulatory Visit: Payer: Self-pay

## 2019-12-27 ENCOUNTER — Inpatient Hospital Stay: Payer: Managed Care, Other (non HMO) | Admitting: Oncology

## 2019-12-27 ENCOUNTER — Encounter: Payer: Self-pay | Admitting: Oncology

## 2019-12-27 VITALS — BP 124/78 | HR 66 | Temp 96.5°F | Resp 18 | Wt 193.6 lb

## 2019-12-27 DIAGNOSIS — R222 Localized swelling, mass and lump, trunk: Secondary | ICD-10-CM | POA: Diagnosis not present

## 2019-12-27 DIAGNOSIS — Z148 Genetic carrier of other disease: Secondary | ICD-10-CM

## 2019-12-27 DIAGNOSIS — R7989 Other specified abnormal findings of blood chemistry: Secondary | ICD-10-CM

## 2019-12-27 DIAGNOSIS — Z809 Family history of malignant neoplasm, unspecified: Secondary | ICD-10-CM

## 2019-12-27 DIAGNOSIS — K824 Cholesterolosis of gallbladder: Secondary | ICD-10-CM

## 2019-12-27 NOTE — Progress Notes (Signed)
Hematology/Oncology Follow Up Note [facility]  Telephone:([phone] Fax:([phone]  Patient Care Team: Kirk Ruths, MD as PCP - General (Internal Medicine) Bary Castilla Forest Gleason, MD as Consulting Physician (General Surgery) Ria Bush, MD as Referring Physician (Family Medicine)   Name of the patient: Kyle Demarest  MD:[id number]  09-01-1961   REASON FOR VISIT  follow-up for hemochromatosis/iron overload management.  PERTINENT HEMATOLOGY HISTORY Kyle Velasquez is a 59 y.o. male who has above history reviewed by me today presents for initial consultation for the evaluation of elevated ferritin  Patient follows up with Dr. Ouida Sills PCP and recently had labs done.  He has normal liver function.  Ferritin level was elevated at 628 on 02/22/2019. he denies any family history of hemochromatosis or anyone with condition needs frequent blood removal. He also denies any fatigue, joint pain, abdominal pain or leg swelling. Denies alcohol use Patient had colonoscopy done on 08/27/2019 which showed normal colon and a small internal hemorrhoids.  INTERVAL HISTORY Ahmed Vreeland is a 59 y.o. male who has above history reviewed by me today presents for follow up visit for management of heterozygous hemochromatosis, elevated ferritin, breast lump. Problems and complaints are listed below: Patient reports feeling well today. He noticed a lump below left breast for about a year. Denies any nipple discharges. Family history positive for breast cancer in his sister, maternal aunt, and paternal aunt,  prostate cancer in his maternal uncle,. Otherwise he has no new complaints today. Review of Systems  Constitutional: Positive for fatigue. Negative for appetite change, chills, fever and unexpected weight change.  HENT:   Negative for hearing loss and voice change.   Eyes: Negative for eye problems and icterus.  Respiratory: Negative for chest tightness, cough  and shortness of breath.   Cardiovascular: Negative for chest pain and leg swelling.  Gastrointestinal: Negative for abdominal distention and abdominal pain.  Endocrine: Negative for hot flashes.  Genitourinary: Negative for difficulty urinating, dysuria and frequency.   Musculoskeletal: Negative for arthralgias.  Skin: Negative for itching and rash.  Neurological: Negative for light-headedness and numbness.  Hematological: Negative for adenopathy. Does not bruise/bleed easily.  Psychiatric/Behavioral: Negative for confusion.  Lump below left breast.   No Known Allergies   Past Medical History:  Diagnosis Date  . Dyslipidemia 06/22/2012  . Epiretinal membrane (ERM) of left eye 02/2019  . Hyperlipidemia   . Iron overload 07/26/2019  . Seizures ([place])    LAST HAD SEIZURE IN 1981     Past Surgical History:  Procedure Laterality Date  . COLONOSCOPY  08/2012   WNL, sm int hemorrhoids, rec rpt 10 yrs (Pyrt[facility])  . EXCISION OF SKIN TAG N/A 09/09/2015   Procedure: EXCISION OF SKIN TAG/ANAL SKIN TAGS;  Surgeon: Robert Bellow, MD;  Location: [facility] ORS;  Service: General;  Laterality: N/A;  . VASECTOMY  2010   Dr. Purvis Kilts [facility])    Social History   Socioeconomic History  . Marital status: Married    Spouse name: Not on file  . Number of children: 2  . Years of education: Not on file  . Highest education level: Not on file  Occupational History  . Occupation: Sales promotion account executive  Tobacco Use  . Smoking status: Former Smoker    Packs/day: 1.00    Years: 5.00    Pack years: 5.00    Types: Cigarettes    Quit date: 12/07/1986    Years since quitting: 33.0  . Smokeless tobacco: Never Used  Substance  and Sexual Activity  . Alcohol use: Yes    Comment: Occasional  . Drug use: No  . Sexual activity: Not on file  Other Topics Concern  . Not on file  Social History Narrative   Caffeine use/day: 2-3 cups coffee/day    Lives with wife, 2 daughters, 1 dog.    Occupation: Social worker    Exercise - basketball weekly, walking 2 miles a day    Diet: some water, fruits/vegetables occasionally, red meat 1x/wk, fish rarely   Social Determinants of Health   Financial Resource Strain:   . Difficulty of Paying Living Expenses: Not on file  Food Insecurity:   . Worried About Charity fundraiser in the Last Year: Not on file  . Ran Out of Food in the Last Year: Not on file  Transportation Needs:   . Lack of Transportation (Medical): Not on file  . Lack of Transportation (Non-Medical): Not on file  Physical Activity:   . Days of Exercise per Week: Not on file  . Minutes of Exercise per Session: Not on file  Stress:   . Feeling of Stress : Not on file  Social Connections:   . Frequency of Communication with Friends and Family: Not on file  . Frequency of Social Gatherings with Friends and Family: Not on file  . Attends Religious Services: Not on file  . Active Member of Clubs or Organizations: Not on file  . Attends Archivist Meetings: Not on file  . Marital Status: Not on file  Intimate Partner Violence:   . Fear of Current or Ex-Partner: Not on file  . Emotionally Abused: Not on file  . Physically Abused: Not on file  . Sexually Abused: Not on file    Family History  Problem Relation Age of Onset  . Hypertension Mother   . Hyperlipidemia Mother   . Other Father        Hypoglycemia  . HIV/AIDS Father        from blood transfusion   . Diabetes Maternal Grandfather   . Heart failure Maternal Grandmother   . Diabetes Paternal Grandfather   . Diabetes Paternal Grandmother   . Breast cancer Sister   . Breast cancer Maternal Aunt   . Coronary artery disease Paternal Uncle        MI  . Prostate cancer Maternal Uncle   . Breast cancer Paternal Aunt   . Stroke Neg Hx   . Colon cancer Neg Hx   . Stomach cancer Neg Hx      Current Outpatient Medications:  .  atorvastatin (LIPITOR) 20 MG tablet, Take 20 mg by mouth daily., Disp: ,  Rfl:  .  Coenzyme Q10 (CO Q 10) 100 MG CAPS, Take 2 tablets by mouth daily., Disp: , Rfl:  .  loratadine-pseudoephedrine (CLARITIN-D 24-HOUR) 10-240 MG per 24 hr tablet, Take 1 tablet by mouth as needed. , Disp: , Rfl:   Physical exam:  Vitals:   12/27/19 1300  BP: 124/78  Pulse: 66  Resp: 18  Temp: (!) 96.5 F (35.8 C)  Weight: 193 lb 9.6 oz (87.8 kg)   Physical Exam Constitutional:      General: He is not in acute distress. HENT:     Head: Normocephalic and atraumatic.  Eyes:     General: No scleral icterus.    Pupils: Pupils are equal, round, and reactive to light.  Cardiovascular:     Rate and Rhythm: Normal rate and regular rhythm.  Heart sounds: Normal heart sounds.  Pulmonary:     Effort: Pulmonary effort is normal. No respiratory distress.     Breath sounds: No wheezing.  Abdominal:     General: Bowel sounds are normal. There is no distension.     Palpations: Abdomen is soft. There is no mass.     Tenderness: There is no abdominal tenderness.  Musculoskeletal:        General: No deformity. Normal range of motion.     Cervical back: Normal range of motion and neck supple.  Skin:    General: Skin is warm and dry.     Findings: No erythema or rash.  Neurological:     Mental Status: He is alert and oriented to person, place, and time.     Cranial Nerves: No cranial nerve deficit.     Coordination: Coordination normal.  Psychiatric:        Behavior: Behavior normal.        Thought Content: Thought content normal.   No palpable mass bilateral breasts. Palpable very small cystic mass on his chest wall, below left breast, nontender.  CMP Latest Ref Rng & Units 10/13/2019  Glucose 70 - 99 mg/dL 97  BUN 6 - 20 mg/dL 21(H)  Creatinine 0.61 - 1.24 mg/dL 1.10  Sodium 135 - 145 mmol/L 135  Potassium 3.5 - 5.1 mmol/L 4.0  Chloride 98 - 111 mmol/L 107  CO2 22 - 32 mmol/L 22  Calcium 8.9 - 10.3 mg/dL 8.9  Total Protein 6.5 - 8.1 g/dL 6.7  Total Bilirubin 0.3 - 1.2  mg/dL 0.7  Alkaline Phos 38 - 126 U/L 57  AST 15 - 41 U/L 19  ALT 0 - 44 U/L 28   CBC Latest Ref Rng & Units 12/25/2019  WBC 4.0 - 10.5 K/uL 5.6  Hemoglobin 13.0 - 17.0 g/dL 14.1  Hematocrit 39.0 - 52.0 % 44.0  Platelets 150 - 400 K/uL 174    RADIOGRAPHIC STUDIES: I have personally reviewed the radiological images as listed and agreed with the findings in the report. No results found.   Assessment and plan  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 Gallbladder polyp   3. Elevated ferritin level   4. Mass of chest wall, left   5. Family history of cancer    Heterozygous H63D mutation, historyof elevated ferritin, Labs are reviewed and discussed with patient. Ferritin has normalized. Iron saturation has decreased.  Discussed with patient that I will hold phlebotomy at this point. Continue monitor.  #Gallbladder polyps, repeat ultrasound right upper quadrant for monitoring #Left chest wall mass, location of the mass is below left breast. Will obtain ultrasound soft tissue for further evaluation.  #Family history of breast cancer, prostate cancer, discussed with patient about genetic testing. He will think about it and update me if he wants.   Orders Placed This Encounter  Procedures  . US Abdomen Limited    Standing Status:   Future    Standing Expiration Date:   12/26/2020    Order Specific Question:   Reason for Exam (SYMPTOM  OR DIAGNOSIS REQUIRED)    Answer:   gall bladder polyps    Order Specific Question:   Preferred imaging location?    Answer:   [facility]  . [place] CHEST SOFT TISSUE    Standing Status:   Future    Standing Expiration Date:   02/23/2021    Order Specific Question:   Reason for Exam (SYMPTOM  OR DIAGNOSIS REQUIRED)  Answer:   palpable chest wall lump    Order Specific Question:   Preferred imaging location?    Answer:   [facility]  . CBC with Differential/Platelet    Standing Status:   Future    Standing Expiration Date:   06/25/2021  .  Ferritin    Standing Status:   Future    Standing Expiration Date:   06/25/2021  . Iron and TIBC    Standing Status:   Future    Standing Expiration Date:   06/25/2021    Follow up in 6 months.   Earlie Server, MD, PhD Hematology Oncology [facility] at [facility] Pager- [phone] 12/27/2019

## 2019-12-27 NOTE — Progress Notes (Signed)
Has a lump in left breast area that has been checked by PCP but would like Dr. Tasia Catchings to access as well.

## 2020-01-02 ENCOUNTER — Encounter: Payer: Self-pay | Admitting: Oncology

## 2020-01-02 ENCOUNTER — Other Ambulatory Visit: Payer: Self-pay

## 2020-01-02 ENCOUNTER — Ambulatory Visit
Admission: RE | Admit: 2020-01-02 | Discharge: 2020-01-02 | Disposition: A | Discharge status: Home / Self Care | Payer: Managed Care, Other (non HMO) | Source: Ambulatory Visit | Attending: Oncology | Admitting: Oncology

## 2020-01-02 DIAGNOSIS — K824 Cholesterolosis of gallbladder: Secondary | ICD-10-CM | POA: Diagnosis present

## 2020-01-02 DIAGNOSIS — R222 Localized swelling, mass and lump, trunk: Secondary | ICD-10-CM | POA: Diagnosis present

## 2020-02-24 ENCOUNTER — Encounter: Payer: Self-pay | Admitting: Oncology

## 2020-06-24 ENCOUNTER — Inpatient Hospital Stay: Payer: Managed Care, Other (non HMO) | Attending: Oncology

## 2020-06-24 ENCOUNTER — Other Ambulatory Visit: Payer: Self-pay

## 2020-06-24 DIAGNOSIS — R222 Localized swelling, mass and lump, trunk: Secondary | ICD-10-CM | POA: Diagnosis not present

## 2020-06-24 DIAGNOSIS — Z8719 Personal history of other diseases of the digestive system: Secondary | ICD-10-CM | POA: Insufficient documentation

## 2020-06-24 DIAGNOSIS — Z8349 Family history of other endocrine, nutritional and metabolic diseases: Secondary | ICD-10-CM | POA: Diagnosis not present

## 2020-06-24 DIAGNOSIS — Z803 Family history of malignant neoplasm of breast: Secondary | ICD-10-CM | POA: Diagnosis not present

## 2020-06-24 DIAGNOSIS — Z87891 Personal history of nicotine dependence: Secondary | ICD-10-CM | POA: Diagnosis not present

## 2020-06-24 DIAGNOSIS — Z8249 Family history of ischemic heart disease and other diseases of the circulatory system: Secondary | ICD-10-CM | POA: Diagnosis not present

## 2020-06-24 DIAGNOSIS — Z79899 Other long term (current) drug therapy: Secondary | ICD-10-CM | POA: Diagnosis not present

## 2020-06-24 DIAGNOSIS — Z833 Family history of diabetes mellitus: Secondary | ICD-10-CM | POA: Diagnosis not present

## 2020-06-24 DIAGNOSIS — K824 Cholesterolosis of gallbladder: Secondary | ICD-10-CM | POA: Insufficient documentation

## 2020-06-24 DIAGNOSIS — R7989 Other specified abnormal findings of blood chemistry: Secondary | ICD-10-CM | POA: Diagnosis not present

## 2020-06-24 DIAGNOSIS — Z831 Family history of other infectious and parasitic diseases: Secondary | ICD-10-CM | POA: Diagnosis not present

## 2020-06-24 DIAGNOSIS — Z8042 Family history of malignant neoplasm of prostate: Secondary | ICD-10-CM | POA: Insufficient documentation

## 2020-06-24 DIAGNOSIS — Z148 Genetic carrier of other disease: Secondary | ICD-10-CM

## 2020-06-24 LAB — CBC WITH DIFFERENTIAL/PLATELET
Abs Immature Granulocytes: 0.03 10*3/uL (ref 0.00–0.07)
Basophils Absolute: 0 10*3/uL (ref 0.0–0.1)
Basophils Relative: 1 %
Eosinophils Absolute: 0.1 10*3/uL (ref 0.0–0.5)
Eosinophils Relative: 2 %
HCT: 44.4 % (ref 39.0–52.0)
Hemoglobin: 14.9 g/dL (ref 13.0–17.0)
Immature Granulocytes: 1 %
Lymphocytes Relative: 39 %
Lymphs Abs: 2.4 10*3/uL (ref 0.7–4.0)
MCH: 29.6 pg (ref 26.0–34.0)
MCHC: 33.6 g/dL (ref 30.0–36.0)
MCV: 88.1 fL (ref 80.0–100.0)
Monocytes Absolute: 0.5 10*3/uL (ref 0.1–1.0)
Monocytes Relative: 9 %
Neutro Abs: 3.1 10*3/uL (ref 1.7–7.7)
Neutrophils Relative %: 48 %
Platelets: 188 10*3/uL (ref 150–400)
RBC: 5.04 MIL/uL (ref 4.22–5.81)
RDW: 13.2 % (ref 11.5–15.5)
WBC: 6.2 10*3/uL (ref 4.0–10.5)
nRBC: 0 % (ref 0.0–0.2)

## 2020-06-24 LAB — HEPATIC FUNCTION PANEL
ALT: 25 U/L (ref 0–44)
AST: 20 U/L (ref 15–41)
Albumin: 4.3 g/dL (ref 3.5–5.0)
Alkaline Phosphatase: 60 U/L (ref 38–126)
Bilirubin, Direct: <0.1 mg/dL (ref 0.0–0.2)
Total Bilirubin: 0.6 mg/dL (ref 0.3–1.2)
Total Protein: 7.4 g/dL (ref 6.5–8.1)

## 2020-06-24 LAB — IRON AND TIBC
Iron: 100 ug/dL (ref 45–182)
Saturation Ratios: 33 % (ref 17.9–39.5)
TIBC: 304 ug/dL (ref 250–450)
UIBC: 204 ug/dL

## 2020-06-24 LAB — FERRITIN: Ferritin: 260 ng/mL (ref 24–336)

## 2020-06-25 ENCOUNTER — Inpatient Hospital Stay: Payer: Managed Care, Other (non HMO)

## 2020-06-25 ENCOUNTER — Inpatient Hospital Stay (HOSPITAL_BASED_OUTPATIENT_CLINIC_OR_DEPARTMENT_OTHER): Payer: Managed Care, Other (non HMO) | Admitting: Oncology

## 2020-06-25 ENCOUNTER — Encounter: Payer: Self-pay | Admitting: Oncology

## 2020-06-25 VITALS — BP 130/89 | HR 62 | Temp 96.5°F | Resp 18 | Wt 192.6 lb

## 2020-06-25 DIAGNOSIS — Z148 Genetic carrier of other disease: Secondary | ICD-10-CM

## 2020-06-25 DIAGNOSIS — K824 Cholesterolosis of gallbladder: Secondary | ICD-10-CM | POA: Diagnosis not present

## 2020-06-25 DIAGNOSIS — R7989 Other specified abnormal findings of blood chemistry: Secondary | ICD-10-CM

## 2020-06-25 NOTE — Progress Notes (Signed)
Hematology/Oncology Follow Up Note [facility]  Telephone:([phone] [phone]  Patient Care Team: Kirk Ruths, MD as PCP - General (Internal Medicine) Bary Castilla Forest Gleason, MD as Consulting Physician (General Surgery) Ria Bush, MD as Referring Physician (Family Medicine)   Name of the patient: Kyle Velasquez  [id number]  23-Jan-1961   REASON FOR VISIT  follow-up for hemochromatosis/iron overload management.  PERTINENT HEMATOLOGY HISTORY Kyle Velasquez is a 59 y.o. male who has above history reviewed by me today presents for initial consultation for the evaluation of elevated ferritin  Patient follows up with Dr. Ouida Sills PCP and recently had labs done.  He has normal liver function.  Ferritin level was elevated at 628 on 02/22/2019. he denies any family history of hemochromatosis or anyone with condition needs frequent blood removal. He also denies any fatigue, joint pain, abdominal pain or leg swelling. Denies alcohol use Patient had colonoscopy done on 08/27/2019 which showed normal colon and a small internal hemorrhoids.  INTERVAL HISTORY Kyle Velasquez is a 59 y.o. male who has above history reviewed by me today presents for follow up visit for management of heterozygous hemochromatosis, elevated ferritin, breast lump. Problems and complaints are listed below: Patient has no new complaint.   Review of Systems  Constitutional: Negative for appetite change, chills, fatigue, fever and unexpected weight change.  HENT:   Negative for hearing loss and voice change.   Eyes: Negative for eye problems and icterus.  Respiratory: Negative for chest tightness, cough and shortness of breath.   Cardiovascular: Negative for chest pain and leg swelling.  Gastrointestinal: Negative for abdominal distention and abdominal pain.  Endocrine: Negative for hot flashes.  Genitourinary: Negative for difficulty urinating, dysuria and frequency.    Musculoskeletal: Negative for arthralgias.  Skin: Negative for itching and rash.  Neurological: Negative for light-headedness and numbness.  Hematological: Negative for adenopathy. Does not bruise/bleed easily.  Psychiatric/Behavioral: Negative for confusion.     No Known Allergies   Past Medical History:  Diagnosis Date  . Dyslipidemia 06/22/2012  . Epiretinal membrane (ERM) of left eye 02/2019  . Hyperlipidemia   . Iron overload 07/26/2019  . Seizures ([place])    LAST HAD SEIZURE IN 1981     Past Surgical History:  Procedure Laterality Date  . COLONOSCOPY  08/2012   WNL, sm int hemorrhoids, rec rpt 10 yrs ([place])  . EXCISION OF SKIN TAG N/A 09/09/2015   Procedure: EXCISION OF SKIN TAG/ANAL SKIN TAGS;  Surgeon: Robert Bellow, MD;  Location: [facility] ORS;  Service: General;  Laterality: N/A;  . VASECTOMY  2010   Dr. Purvis Kilts [facility])    Social History   Socioeconomic History  . Marital status: Married    Spouse name: Not on file  . Number of children: 2  . Years of education: Not on file  . Highest education level: Not on file  Occupational History  . Occupation: Sales promotion account executive  Tobacco Use  . Smoking status: Former Smoker    Packs/day: 1.00    Years: 5.00    Pack years: 5.00    Types: Cigarettes    Quit date: 12/07/1986    Years since quitting: 33.5  . Smokeless tobacco: Never Used  Vaping Use  . Vaping Use: Never used  Substance and Sexual Activity  . Alcohol use: Yes    Comment: Occasional  . Drug use: No  . Sexual activity: Not on file  Other Topics Concern  . Not on file  Social History  Narrative   Caffeine use/day: 2-3 cups coffee/day    Lives with wife, 2 daughters, 1 dog.    Occupation: Network engineer    Exercise - basketball weekly, walking 2 miles a day    Diet: some water, fruits/vegetables occasionally, red meat 1x/wk, fish rarely   Social Determinants of Radio broadcast assistant Strain:   . Difficulty of Paying Living  Expenses:   Food Insecurity:   . Worried About Charity fundraiser in the Last Year:   . Arboriculturist in the Last Year:   Transportation Needs:   . Film/video editor (Medical):   Marland Kitchen Lack of Transportation (Non-Medical):   Physical Activity:   . Days of Exercise per Week:   . Minutes of Exercise per Session:   Stress:   . Feeling of Stress :   Social Connections:   . Frequency of Communication with Friends and Family:   . Frequency of Social Gatherings with Friends and Family:   . Attends Religious Services:   . Active Member of Clubs or Organizations:   . Attends Archivist Meetings:   Marland Kitchen Marital Status:   Intimate Partner Violence:   . Fear of Current or Ex-Partner:   . Emotionally Abused:   Marland Kitchen Physically Abused:   . Sexually Abused:     Family History  Problem Relation Age of Onset  . Hypertension Mother   . Hyperlipidemia Mother   . Other Father        Hypoglycemia  . HIV/AIDS Father        from blood transfusion   . Diabetes Maternal Grandfather   . Heart failure Maternal Grandmother   . Diabetes Paternal Grandfather   . Diabetes Paternal Grandmother   . Breast cancer Sister   . Breast cancer Maternal Aunt   . Coronary artery disease Paternal Uncle        MI  . Prostate cancer Maternal Uncle   . Breast cancer Paternal Aunt   . Stroke Neg Hx   . Colon cancer Neg Hx   . Stomach cancer Neg Hx      Current Outpatient Medications:  .  atorvastatin (LIPITOR) 20 MG tablet, Take 20 mg by mouth daily., Disp: , Rfl:  .  Coenzyme Q10 (CO Q 10) 100 MG CAPS, Take 2 tablets by mouth daily., Disp: , Rfl:  .  loratadine-pseudoephedrine (CLARITIN-D 24-HOUR) 10-240 MG per 24 hr tablet, Take 1 tablet by mouth as needed. , Disp: , Rfl:   Physical exam:  Vitals:   06/25/20 1350  BP: 130/89  Pulse: 62  Resp: 18  Temp: (!) 96.5 F (35.8 C)  Weight: 192 lb 9.6 oz (87.4 kg)   Physical Exam Constitutional:      General: He is not in acute distress. HENT:      Head: Normocephalic and atraumatic.  Eyes:     General: No scleral icterus.    Pupils: Pupils are equal, round, and reactive to light.  Cardiovascular:     Rate and Rhythm: Normal rate and regular rhythm.     Heart sounds: Normal heart sounds.  Pulmonary:     Effort: Pulmonary effort is normal. No respiratory distress.     Breath sounds: No wheezing.  Abdominal:     General: Bowel sounds are normal. There is no distension.     Palpations: Abdomen is soft. There is no mass.     Tenderness: There is no abdominal tenderness.  Musculoskeletal:  General: No deformity. Normal range of motion.     Cervical back: Normal range of motion and neck supple.  Skin:    General: Skin is warm and dry.     Findings: No erythema or rash.  Neurological:     Mental Status: He is alert and oriented to person, place, and time.     Cranial Nerves: No cranial nerve deficit.     Coordination: Coordination normal.  Psychiatric:        Behavior: Behavior normal.        Thought Content: Thought content normal.     CMP Latest Ref Rng & Units 06/24/2020  Glucose 70 - 99 mg/dL -  BUN 6 - 20 mg/dL -  Creatinine 0.61 - 1.24 mg/dL -  Sodium 135 - 145 mmol/L -  Potassium 3.5 - 5.1 mmol/L -  Chloride 98 - 111 mmol/L -  CO2 22 - 32 mmol/L -  Calcium 8.9 - 10.3 mg/dL -  Total Protein 6.5 - 8.1 g/dL 7.4  Total Bilirubin 0.3 - 1.2 mg/dL 0.6  Alkaline Phos 38 - 126 U/L 60  AST 15 - 41 U/L 20  ALT 0 - 44 U/L 25   CBC Latest Ref Rng & Units 06/24/2020  WBC 4.0 - 10.5 K/uL 6.2  Hemoglobin 13.0 - 17.0 g/dL 14.9  Hematocrit 39 - 52 % 44.4  Platelets 150 - 400 K/uL 188    RADIOGRAPHIC STUDIES: I have personally reviewed the radiological images as listed and agreed with the findings in the report. No results found.   Assessment and plan  1. Elevated ferritin level   2. Hemochromatosis carrier   3. Gallbladder polyp    Heterozygous H63D mutation, historyof elevated ferritin, Labs are reviewed  and discussed with patient. Stale iron panel, ferritin <500, iron saturation is <45.  No phlebotomy.   #Gallbladder polyps, 01/02/2020  ultrasound right upper quadrant showed similar appearance gallbladder polyps 14mm.  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   #Family history of breast cancer, prostate cancer, discussed with patient about genetic testing. He will think about it and update me if he wants.   Orders Placed This Encounter  Procedures  . CBC with Differential/Platelet    Standing Status:   Future    Standing Expiration Date:   06/25/2021  . Ferritin    Standing Status:   Future    Standing Expiration Date:   06/25/2021  . Iron and TIBC    Standing Status:   Future    Standing Expiration Date:   06/25/2021  . Hepatic function panel    Standing Status:   Future    Standing Expiration Date:   06/25/2021    Follow up in 6 months.   Earlie Server, MD, PhD Hematology Oncology [facility] at [facility] Pager- [phone] 06/25/2020

## 2020-06-25 NOTE — Progress Notes (Signed)
Patient denies new problems/concerns today.   °

## 2020-08-16 ENCOUNTER — Encounter: Payer: Self-pay | Admitting: Family Medicine

## 2020-08-16 DIAGNOSIS — H43812 Vitreous degeneration, left eye: Secondary | ICD-10-CM | POA: Insufficient documentation

## 2020-10-23 IMAGING — US US ABDOMEN LIMITED W/ ELASTOGRAPHY
2 series · 12 of 25 positions shown · non-contrast
Comparison: 05/05/2019

CLINICAL DATA: Hemochromatosis, high ferritin

EXAM:
US ABDOMEN LIMITED - RIGHT UPPER QUADRANT
ULTRASOUND HEPATIC ELASTOGRAPHY
TECHNIQUE: Limited right upper quadrant abdominal ultrasound was performed. In
addition, ultrasound elastography evaluation of the liver was
performed. A region of interest was placed in the right lobe of the
liver. Following application of a compressive sonographic pulse,
shear waves were detected in the adjacent hepatic tissue and the
shear wave velocity was calculated. Multiple assessments were
performed at the selected site. Median shear wave velocity is
correlated to a Metavir fibrosis score.

[Series 1: us abdomen limited w/ elastography · 6 of 47 slices shown (1 of 2)]
[im 4/47]
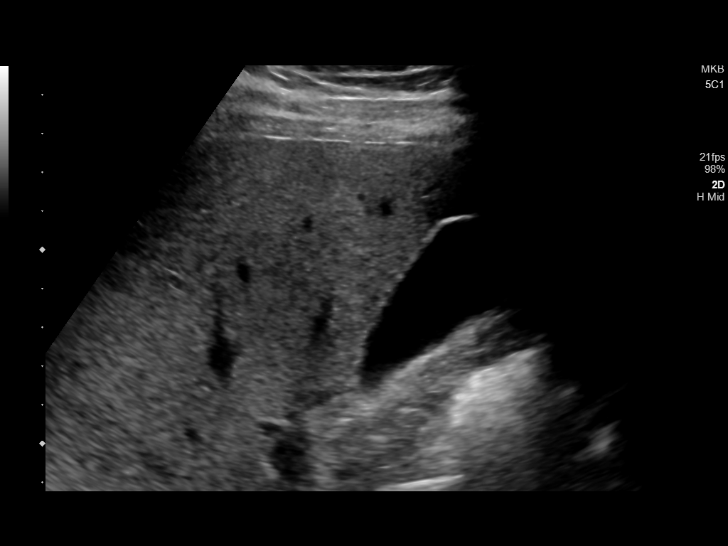
[im 12/47]
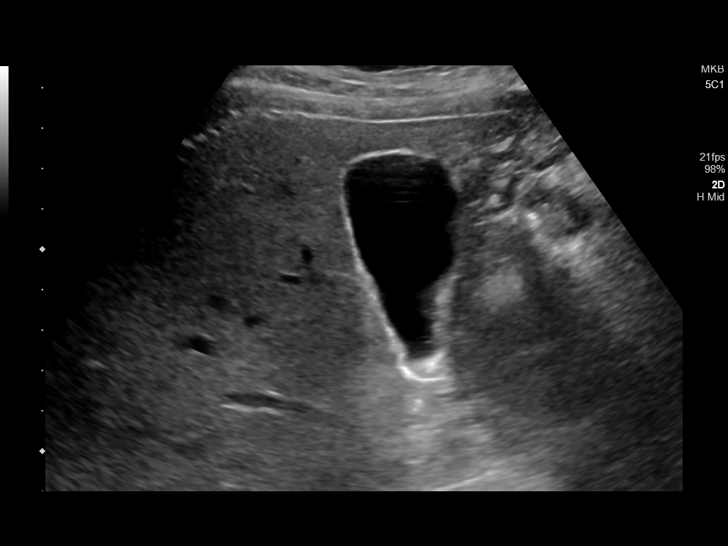
[im 20/47]
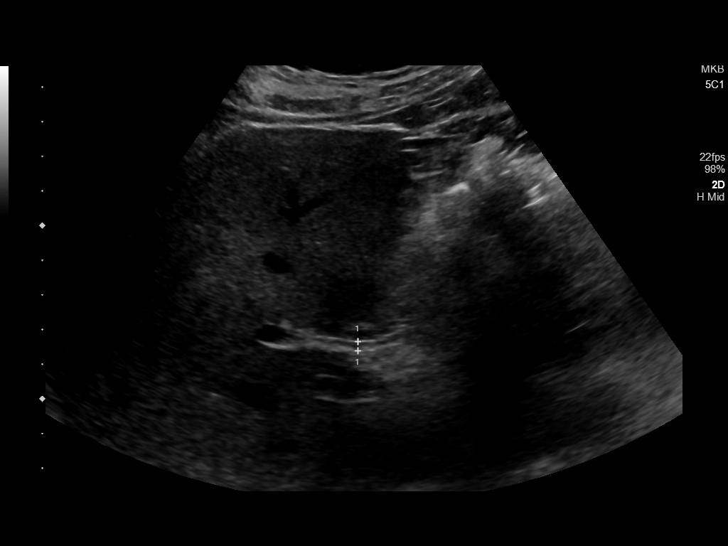
[im 27/47]
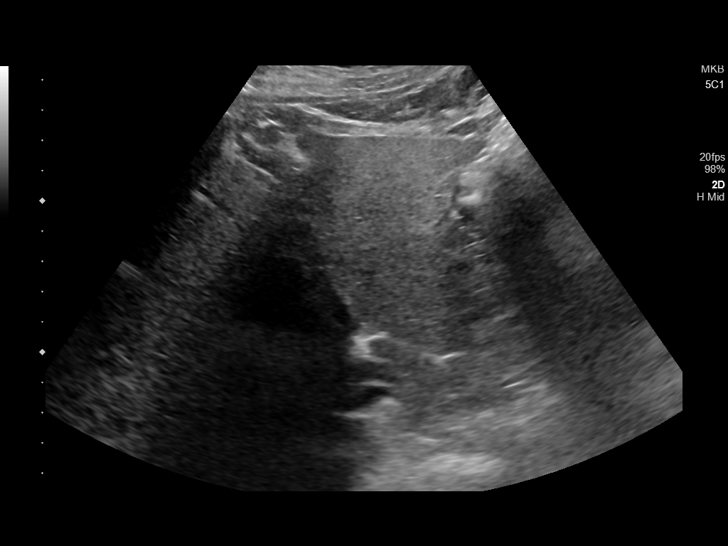
[im 35/47]
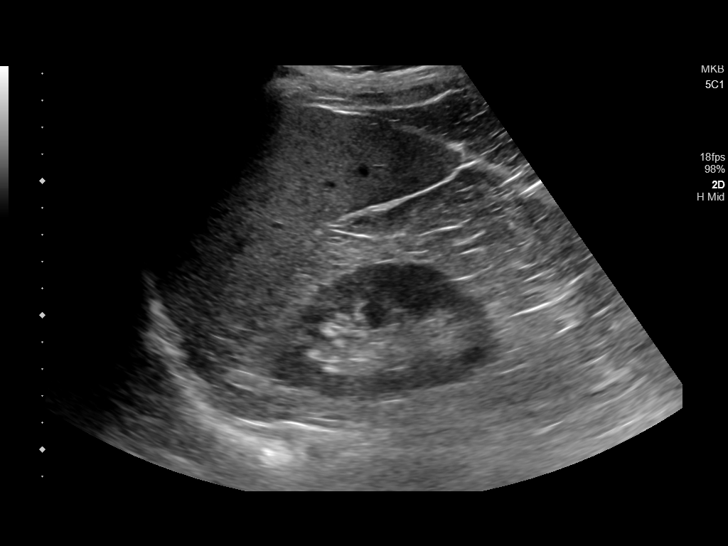
[im 43/47]
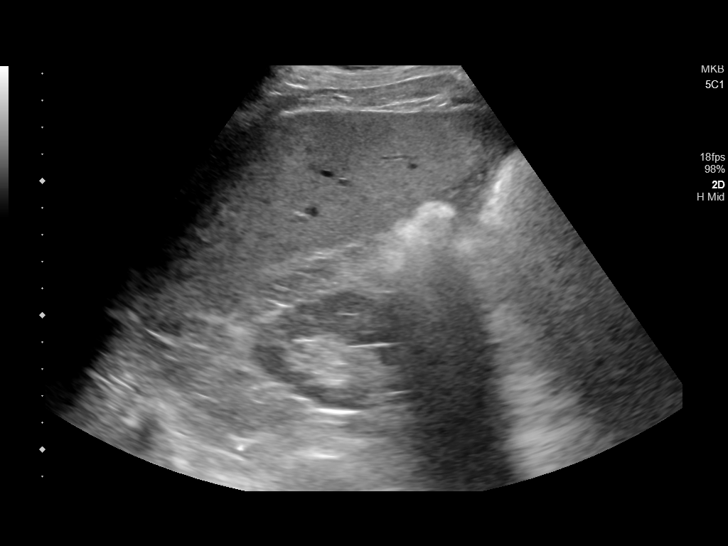

[Series 1001: us abdomen limited w/ elastography · 6 of 42 slices shown (2 of 2)]
[im 1/42]
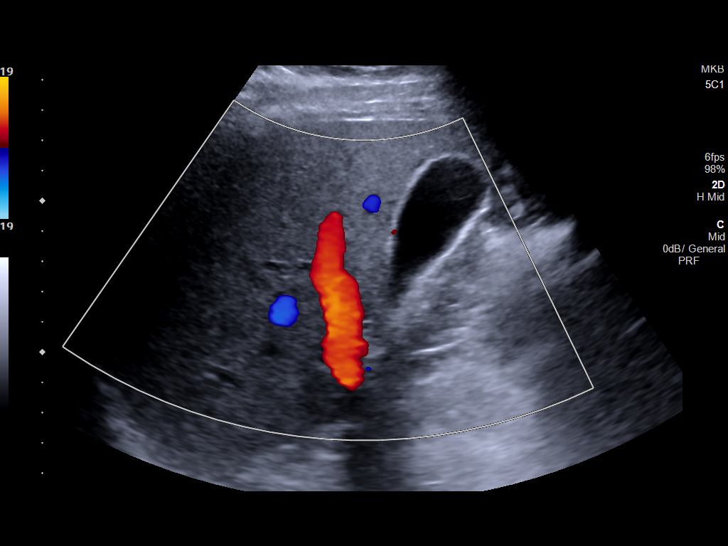
[im 8/42]
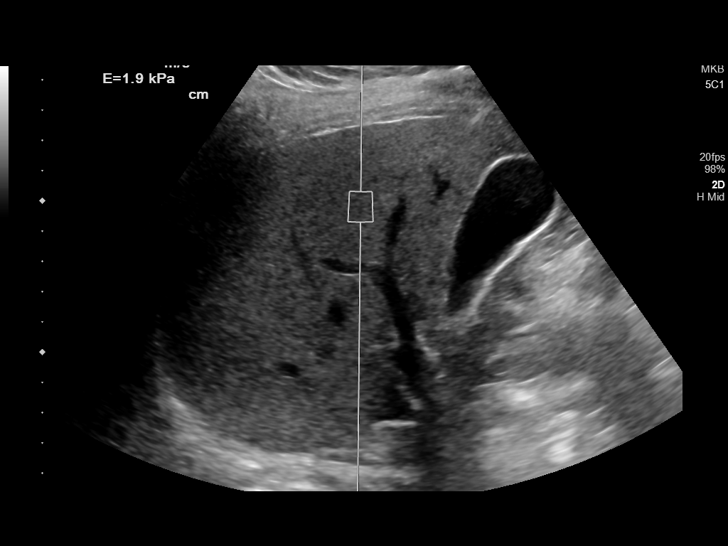
[im 15/42]
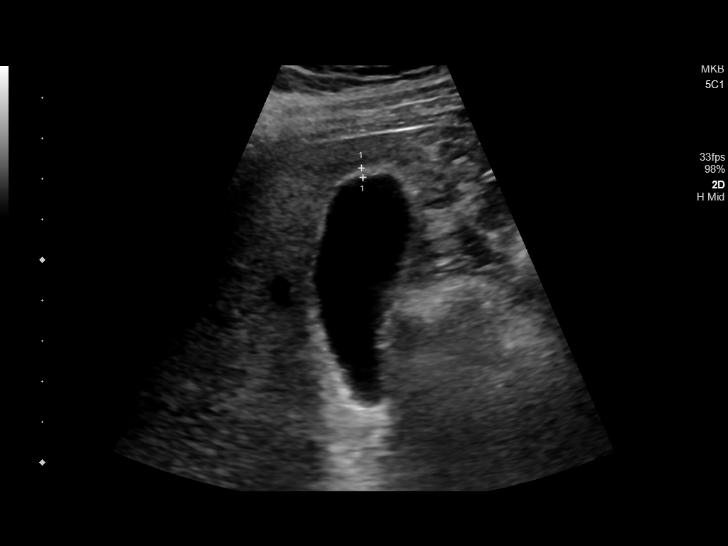
[im 23/42]
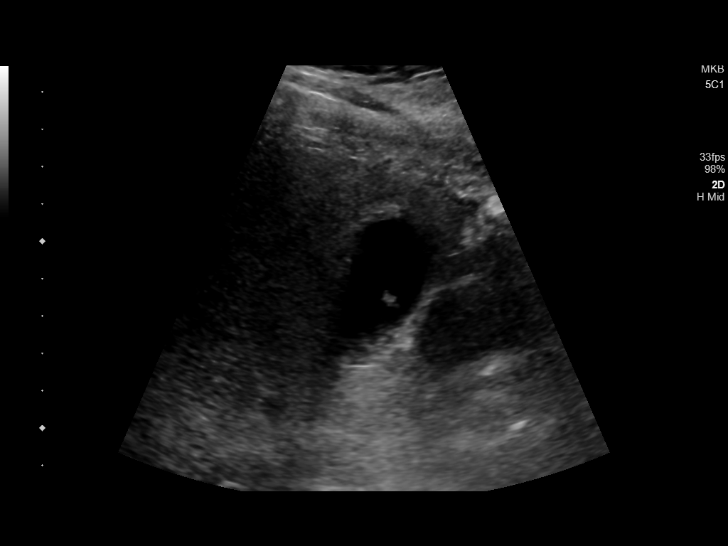
[im 30/42]
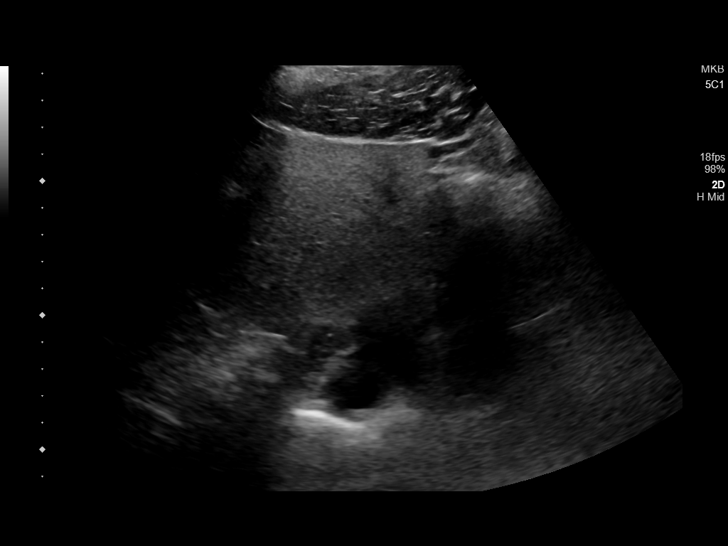
[im 38/42]
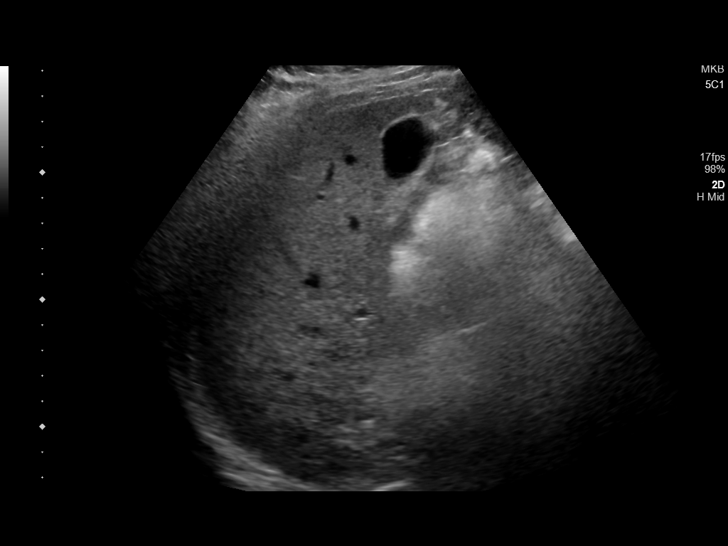

[12 of 25 positions shown; findings below may reference images not displayed]

FINDINGS: ULTRASOUND ABDOMEN LIMITED RIGHT UPPER QUADRANT

Gallbladder:

Echogenic nonmobile focus measuring 5 mm in diameter on image [DATE],
compatible with a polyp. No gallbladder wall thickening. Sonographic
Murphy sign is on docking.

Common bile duct:

Diameter: 3 mm

Liver:

No focal lesion identified. Diffuse accentuated echogenicity in the
hepatic parenchyma. Portal vein is patent on color Doppler imaging
with normal direction of blood flow towards the liver.

ULTRASOUND HEPATIC ELASTOGRAPHY

Device: Siemens Helix VTQ

Patient position: Supine

Transducer [id number]

Number of measurements: 10

Hepatic segment:  8

Median velocity:   0.83 m/sec

IQR:

IQR/Median velocity ratio:

Corresponding Metavir fibrosis score:  F0/F1

Risk of fibrosis: Minimal

Limitations of exam: None

Please note that abnormal shear wave velocities may also be
identified in clinical settings other than with hepatic fibrosis,
such as: acute hepatitis, elevated right heart and central venous
pressures including use of beta blockers, Quirijn disease
([place]), infiltrative processes such as
mastocytosis/amyloidosis/infiltrative tumor, extrahepatic
cholestasis, in the post-prandial state, and liver transplantation.
Correlation with patient history, laboratory data, and clinical
condition recommended.
IMPRESSION: ULTRASOUND ABDOMEN:

1. 5 mm gallbladder polyp. Given the patient's age, a follow up
ultrasound is recommended in 6-12 months time. This recommendation
is in compliance with the 2371 joint guidelines between the European
Society of Gastrointestinal and Abdominal Radiology (JHON E),
European Association for Endoscopic Surgery and other Interventional
Techniques (EAES), International Society of Digestive Surgery -
European Federation (EFISDS) and European Society of
Gastrointestinal Endoscopy (ESGE).
2. Echogenic liver, nonspecific but possibly from steatosis.

ULTRASOUND HEPATIC ELASTOGRAHY:

Median hepatic shear wave velocity is calculated at 0.83 m/sec.

Corresponding Metavir fibrosis score is F0/F1.

Risk of fibrosis is low.

Follow-up: Not required for this portion of the exam

## 2020-12-25 ENCOUNTER — Other Ambulatory Visit: Payer: Self-pay

## 2020-12-25 ENCOUNTER — Inpatient Hospital Stay: Payer: Managed Care, Other (non HMO) | Attending: Oncology

## 2020-12-25 DIAGNOSIS — Z803 Family history of malignant neoplasm of breast: Secondary | ICD-10-CM | POA: Insufficient documentation

## 2020-12-25 DIAGNOSIS — Z8249 Family history of ischemic heart disease and other diseases of the circulatory system: Secondary | ICD-10-CM | POA: Diagnosis not present

## 2020-12-25 DIAGNOSIS — R7989 Other specified abnormal findings of blood chemistry: Secondary | ICD-10-CM | POA: Diagnosis not present

## 2020-12-25 DIAGNOSIS — R7401 Elevation of levels of liver transaminase levels: Secondary | ICD-10-CM | POA: Insufficient documentation

## 2020-12-25 DIAGNOSIS — Z8349 Family history of other endocrine, nutritional and metabolic diseases: Secondary | ICD-10-CM | POA: Insufficient documentation

## 2020-12-25 DIAGNOSIS — Z87891 Personal history of nicotine dependence: Secondary | ICD-10-CM | POA: Diagnosis not present

## 2020-12-25 DIAGNOSIS — Z8042 Family history of malignant neoplasm of prostate: Secondary | ICD-10-CM | POA: Diagnosis not present

## 2020-12-25 DIAGNOSIS — Z833 Family history of diabetes mellitus: Secondary | ICD-10-CM | POA: Diagnosis not present

## 2020-12-25 DIAGNOSIS — Z830 Family history of human immunodeficiency virus [HIV] disease: Secondary | ICD-10-CM | POA: Insufficient documentation

## 2020-12-25 DIAGNOSIS — Z79899 Other long term (current) drug therapy: Secondary | ICD-10-CM | POA: Diagnosis not present

## 2020-12-25 DIAGNOSIS — Z148 Genetic carrier of other disease: Secondary | ICD-10-CM

## 2020-12-25 LAB — IRON AND TIBC
Iron: 122 ug/dL (ref 45–182)
Saturation Ratios: 44 % — ABNORMAL HIGH (ref 17.9–39.5)
TIBC: 279 ug/dL (ref 250–450)
UIBC: 157 ug/dL

## 2020-12-25 LAB — CBC WITH DIFFERENTIAL/PLATELET
Abs Immature Granulocytes: 0.03 10*3/uL (ref 0.00–0.07)
Basophils Absolute: 0.1 10*3/uL (ref 0.0–0.1)
Basophils Relative: 1 %
Eosinophils Absolute: 0.1 10*3/uL (ref 0.0–0.5)
Eosinophils Relative: 1 %
HCT: 44 % (ref 39.0–52.0)
Hemoglobin: 14.6 g/dL (ref 13.0–17.0)
Immature Granulocytes: 1 %
Lymphocytes Relative: 26 %
Lymphs Abs: 1.2 10*3/uL (ref 0.7–4.0)
MCH: 29.3 pg (ref 26.0–34.0)
MCHC: 33.2 g/dL (ref 30.0–36.0)
MCV: 88.4 fL (ref 80.0–100.0)
Monocytes Absolute: 0.5 10*3/uL (ref 0.1–1.0)
Monocytes Relative: 11 %
Neutro Abs: 2.8 10*3/uL (ref 1.7–7.7)
Neutrophils Relative %: 60 %
Platelets: 303 10*3/uL (ref 150–400)
RBC: 4.98 MIL/uL (ref 4.22–5.81)
RDW: 14 % (ref 11.5–15.5)
WBC: 4.7 10*3/uL (ref 4.0–10.5)
nRBC: 0 % (ref 0.0–0.2)

## 2020-12-25 LAB — HEPATIC FUNCTION PANEL
ALT: 151 U/L — ABNORMAL HIGH (ref 0–44)
AST: 50 U/L — ABNORMAL HIGH (ref 15–41)
Albumin: 3.7 g/dL (ref 3.5–5.0)
Alkaline Phosphatase: 152 U/L — ABNORMAL HIGH (ref 38–126)
Bilirubin, Direct: 0.1 mg/dL (ref 0.0–0.2)
Indirect Bilirubin: 0.5 mg/dL (ref 0.3–0.9)
Total Bilirubin: 0.6 mg/dL (ref 0.3–1.2)
Total Protein: 7.6 g/dL (ref 6.5–8.1)

## 2020-12-25 LAB — FERRITIN: Ferritin: 670 ng/mL — ABNORMAL HIGH (ref 24–336)

## 2020-12-27 ENCOUNTER — Inpatient Hospital Stay: Payer: Managed Care, Other (non HMO)

## 2020-12-30 ENCOUNTER — Inpatient Hospital Stay: Payer: Managed Care, Other (non HMO)

## 2020-12-30 ENCOUNTER — Encounter: Payer: Self-pay | Admitting: Oncology

## 2020-12-30 ENCOUNTER — Inpatient Hospital Stay: Payer: Managed Care, Other (non HMO) | Admitting: Oncology

## 2020-12-30 VITALS — BP 109/73 | HR 76 | Temp 97.3°F | Resp 18 | Wt 189.9 lb

## 2020-12-30 DIAGNOSIS — K824 Cholesterolosis of gallbladder: Secondary | ICD-10-CM | POA: Diagnosis not present

## 2020-12-30 DIAGNOSIS — Z148 Genetic carrier of other disease: Secondary | ICD-10-CM | POA: Diagnosis not present

## 2020-12-30 DIAGNOSIS — R7401 Elevation of levels of liver transaminase levels: Secondary | ICD-10-CM

## 2020-12-30 DIAGNOSIS — R7989 Other specified abnormal findings of blood chemistry: Secondary | ICD-10-CM

## 2020-12-30 DIAGNOSIS — Z809 Family history of malignant neoplasm, unspecified: Secondary | ICD-10-CM

## 2020-12-30 NOTE — Progress Notes (Signed)
Patient denies new problems/concerns today.   °

## 2020-12-30 NOTE — Progress Notes (Signed)
Hematology/Oncology Follow Up Note [facility]  Telephone:([phone] [phone]  Patient Care Team: Kirk Ruths, MD as PCP - General (Internal Medicine) Bary Castilla Forest Gleason, MD as Consulting Physician (General Surgery) Ria Bush, MD as Referring Physician (Family Medicine)   Name of the patient: Kyle Velasquez  [id number]  [birth year]   REASON FOR VISIT  follow-up for hemochromatosis/iron overload management.  PERTINENT HEMATOLOGY HISTORY Kyle Velasquez is a 60 y.o. male who has above history reviewed by me today presents for initial consultation for the evaluation of elevated ferritin  Patient follows up with Dr. Ouida Sills PCP and recently had labs done.  He has normal liver function.  Ferritin level was elevated at 628 on 02/22/2019. he denies any family history of hemochromatosis or anyone with condition needs frequent blood removal. He also denies any fatigue, joint pain, abdominal pain or leg swelling. Denies alcohol use Patient had colonoscopy done on 08/27/2019 which showed normal colon and a small internal hemorrhoids.  INTERVAL HISTORY Kyle Velasquez is a 60 y.o. male who has above history reviewed by me today presents for follow up visit for management of heterozygous hemochromatosis, elevated ferritin, breast lump. Problems and complaints are listed below: Patient had COVID-19 infection at the beginning of January 2022. Atorvastatin was increased to 40 mg daily 73-month ago.  He has not taking atorvastatin ever since he has been sick from COVID-19.  He reports his symptoms are largely improved.  Denies any shortness of breath, fever or chills today. Denies any alcohol use.  Review of Systems  Constitutional: Negative for appetite change, chills, fatigue, fever and unexpected weight change.  HENT:   Negative for hearing loss and voice change.   Eyes: Negative for eye problems and icterus.  Respiratory: Negative for chest  tightness, cough and shortness of breath.   Cardiovascular: Negative for chest pain and leg swelling.  Gastrointestinal: Negative for abdominal distention and abdominal pain.  Endocrine: Negative for hot flashes.  Genitourinary: Negative for difficulty urinating, dysuria and frequency.   Musculoskeletal: Negative for arthralgias.  Skin: Negative for itching and rash.  Neurological: Negative for light-headedness and numbness.  Hematological: Negative for adenopathy. Does not bruise/bleed easily.  Psychiatric/Behavioral: Negative for confusion.     No Known Allergies   Past Medical History:  Diagnosis Date  . Dyslipidemia 06/22/2012  . Epiretinal membrane (ERM) of left eye 02/2019  . Hyperlipidemia   . Iron overload 07/26/2019  . Seizures ([place])    LAST HAD SEIZURE IN 1981     Past Surgical History:  Procedure Laterality Date  . COLONOSCOPY  08/2012   WNL, sm int hemorrhoids, rec rpt 10 yrs (Pyrtle)  . EXCISION OF SKIN TAG N/A 09/09/2015   Procedure: EXCISION OF SKIN TAG/ANAL SKIN TAGS;  Surgeon: Robert Bellow, MD;  Location: [facility] ORS;  Service: General;  Laterality: N/A;  . VASECTOMY  2010   Dr. Purvis Kilts [facility])    Social History   Socioeconomic History  . Marital status: Married    Spouse name: Not on file  . Number of children: 2  . Years of education: Not on file  . Highest education level: Not on file  Occupational History  . Occupation: Sales promotion account executive  Tobacco Use  . Smoking status: Former Smoker    Packs/day: 1.00    Years: 5.00    Pack years: 5.00    Types: Cigarettes    Quit date: 12/07/1986    Years since quitting: 34.0  . Smokeless tobacco: Never  Used  Vaping Use  . Vaping Use: Never used  Substance and Sexual Activity  . Alcohol use: Yes    Comment: Occasional  . Drug use: No  . Sexual activity: Not on file  Other Topics Concern  . Not on file  Social History Narrative   Caffeine use/day: 2-3 cups coffee/day    Lives with wife, 2 daughters, 1  dog.    Occupation: Network engineer    Exercise - basketball weekly, walking 2 miles a day    Diet: some water, fruits/vegetables occasionally, red meat 1x/wk, fish rarely   Social Determinants of Radio broadcast assistant Strain: Not on file  Food Insecurity: Not on file  Transportation Needs: Not on file  Physical Activity: Not on file  Stress: Not on file  Social Connections: Not on file  Intimate Partner Violence: Not on file    Family History  Problem Relation Age of Onset  . Hypertension Mother   . Hyperlipidemia Mother   . Other Father        Hypoglycemia  . HIV/AIDS Father        from blood transfusion   . Diabetes Maternal Grandfather   . Heart failure Maternal Grandmother   . Diabetes Paternal Grandfather   . Diabetes Paternal Grandmother   . Breast cancer Sister   . Breast cancer Maternal Aunt   . Coronary artery disease Paternal Uncle        MI  . Prostate cancer Maternal Uncle   . Breast cancer Paternal Aunt   . Stroke Neg Hx   . Colon cancer Neg Hx   . Stomach cancer Neg Hx      Current Outpatient Medications:  .  atorvastatin (LIPITOR) 20 MG tablet, Take 20 mg by mouth daily., Disp: , Rfl:  .  Coenzyme Q10 (CO Q 10) 100 MG CAPS, Take 2 tablets by mouth daily., Disp: , Rfl:  .  loratadine-pseudoephedrine (CLARITIN-D 24-HOUR) 10-240 MG per 24 hr tablet, Take 1 tablet by mouth as needed. , Disp: , Rfl:   Physical exam:  Vitals:   12/30/20 1316  BP: 109/73  Pulse: 76  Resp: 18  Temp: (!) 97.3 F (36.3 C)  Weight: 189 lb 14.4 oz (86.1 kg)   Physical Exam Constitutional:      General: He is not in acute distress. HENT:     Head: Normocephalic and atraumatic.  Eyes:     General: No scleral icterus.    Pupils: Pupils are equal, round, and reactive to light.  Cardiovascular:     Rate and Rhythm: Normal rate and regular rhythm.     Heart sounds: Normal heart sounds.  Pulmonary:     Effort: Pulmonary effort is normal. No respiratory  distress.     Breath sounds: No wheezing.  Abdominal:     General: Bowel sounds are normal. There is no distension.     Palpations: Abdomen is soft. There is no mass.     Tenderness: There is no abdominal tenderness.  Musculoskeletal:        General: No deformity. Normal range of motion.     Cervical back: Normal range of motion and neck supple.  Skin:    General: Skin is warm and dry.     Findings: No erythema or rash.  Neurological:     Mental Status: He is alert and oriented to person, place, and time.     Cranial Nerves: No cranial nerve deficit.     Coordination: Coordination  normal.  Psychiatric:        Behavior: Behavior normal.        Thought Content: Thought content normal.     CMP Latest Ref Rng & Units 12/25/2020  Glucose 70 - 99 mg/dL -  BUN 6 - 20 mg/dL -  Creatinine 0.61 - 1.24 mg/dL -  Sodium 135 - 145 mmol/L -  Potassium 3.5 - 5.1 mmol/L -  Chloride 98 - 111 mmol/L -  CO2 22 - 32 mmol/L -  Calcium 8.9 - 10.3 mg/dL -  Total Protein 6.5 - 8.1 g/dL 7.6  Total Bilirubin 0.3 - 1.2 mg/dL 0.6  Alkaline Phos 38 - 126 U/L 152(H)  AST 15 - 41 U/L 50(H)  ALT 0 - 44 U/L 151(H)   CBC Latest Ref Rng & Units 12/25/2020  WBC 4.0 - 10.5 K/uL 4.7  Hemoglobin 13.0 - 17.0 g/dL 14.6  Hematocrit 39.0 - 52.0 % 44.0  Platelets 150 - 400 K/uL 303    RADIOGRAPHIC STUDIES: I have personally reviewed the radiological images as listed and agreed with the findings in the report. No results found.   Assessment and plan  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 Elevated ferritin level   3. Transaminitis   4. Gallbladder polyp   5. Family history of cancer    Heterozygous H63D mutation,  Labs are reviewed and discussed with patient Showed elevated ferritin level at 670, increased iron saturation 44. Iron overload versus acute increase secondary to recent infection.  I will repeat his level  #Transaminitis, increase of alkaline phosphatase. Repeat ultrasound right upper quadrant for  further evaluation ?drug [statin ]effect, Covid 19 infection, versus iron overload. I refer patient to establish care with gastroenterology for further evaluation Check hepatitis.  #Gallbladder polyps, 01/02/2020  ultrasound right upper quadrant showed similar appearance gallbladder polyps 46mm.  He has not established care with surgery  #Family history of breast cancer, prostate cancer, previously discussed with patient about genetic testing.  Patient wanted to defer at that point and update me if he changes his plan.   Orders Placed This Encounter  Procedures  . US Abdomen Limited RUQ (LIVER/GB)    Standing Status:   Future    Standing Expiration Date:   12/30/2021    Order Specific Question:   Reason for Exam (SYMPTOM  OR DIAGNOSIS REQUIRED)    Answer:   transaminitis    Order Specific Question:   Preferred imaging location?    Answer:   [facility]  . Hepatic function panel    Standing Status:   Future    Standing Expiration Date:   12/30/2021  . Hepatitis panel, acute    Standing Status:   Future    Standing Expiration Date:   12/30/2021  . Iron and TIBC    Standing Status:   Future    Standing Expiration Date:   12/30/2021  . Ferritin    Standing Status:   Future    Standing Expiration Date:   12/30/2021  . Ambulatory referral to Gastroenterology    Referral Priority:   Routine    Referral Type:   Consultation    Referral Reason:   Specialty Services Required    Referred to Provider:   Efrain Sella, MD    Requested Specialty:   Gastroenterology    Number of Visits Requested:   1    Follow up to be determined  Earlie Server, MD, PhD Hematology Oncology [facility] Pager- [phone] 12/30/2020

## 2021-01-01 ENCOUNTER — Other Ambulatory Visit: Payer: Self-pay

## 2021-01-01 ENCOUNTER — Ambulatory Visit
Admission: RE | Admit: 2021-01-01 | Discharge: 2021-01-01 | Disposition: A | Discharge status: Home / Self Care | Payer: Managed Care, Other (non HMO) | Source: Ambulatory Visit | Attending: Oncology | Admitting: Oncology

## 2021-01-01 DIAGNOSIS — R7401 Elevation of levels of liver transaminase levels: Secondary | ICD-10-CM | POA: Diagnosis present

## 2021-01-01 DIAGNOSIS — Z148 Genetic carrier of other disease: Secondary | ICD-10-CM | POA: Insufficient documentation

## 2021-01-02 ENCOUNTER — Inpatient Hospital Stay: Payer: Managed Care, Other (non HMO)

## 2021-01-02 DIAGNOSIS — R7401 Elevation of levels of liver transaminase levels: Secondary | ICD-10-CM

## 2021-01-02 DIAGNOSIS — Z148 Genetic carrier of other disease: Secondary | ICD-10-CM

## 2021-01-02 LAB — FERRITIN: Ferritin: 377 ng/mL — ABNORMAL HIGH (ref 24–336)

## 2021-01-02 LAB — HEPATIC FUNCTION PANEL
ALT: 41 U/L (ref 0–44)
AST: 22 U/L (ref 15–41)
Albumin: 3.7 g/dL (ref 3.5–5.0)
Alkaline Phosphatase: 93 U/L (ref 38–126)
Bilirubin, Direct: 0.1 mg/dL (ref 0.0–0.2)
Indirect Bilirubin: 0.5 mg/dL (ref 0.3–0.9)
Total Bilirubin: 0.6 mg/dL (ref 0.3–1.2)
Total Protein: 7.5 g/dL (ref 6.5–8.1)

## 2021-01-02 LAB — IRON AND TIBC
Iron: 78 ug/dL (ref 45–182)
Saturation Ratios: 25 % (ref 17.9–39.5)
TIBC: 311 ug/dL (ref 250–450)
UIBC: 233 ug/dL

## 2021-01-02 LAB — HEPATITIS PANEL, ACUTE
HCV Ab: NONREACTIVE
Hep A IgM: NONREACTIVE
Hep B C IgM: NONREACTIVE
Hepatitis B Surface Ag: NONREACTIVE

## 2021-01-06 ENCOUNTER — Other Ambulatory Visit: Payer: Self-pay | Admitting: *Deleted

## 2021-01-06 ENCOUNTER — Encounter: Payer: Self-pay | Admitting: Oncology

## 2021-01-08 ENCOUNTER — Telehealth: Payer: Self-pay

## 2021-01-08 DIAGNOSIS — Z148 Genetic carrier of other disease: Secondary | ICD-10-CM

## 2021-01-08 DIAGNOSIS — K824 Cholesterolosis of gallbladder: Secondary | ICD-10-CM

## 2021-01-08 DIAGNOSIS — R7989 Other specified abnormal findings of blood chemistry: Secondary | ICD-10-CM

## 2021-01-08 NOTE — Telephone Encounter (Signed)
Patient notified via [place]. Working on referral to Dr. Dwyane Luo office  Please schedule labs in 4 months and Mychart visit 1-2 days after labs. Pt will see appts in Mychart. Lab orders entered. Thanks

## 2021-01-08 NOTE — Telephone Encounter (Signed)
-----   Message from Earlie Server, MD sent at 01/07/2021 11:20 PM EST ----- Please let him know that I recommend him to establish care with surgery Dr.Byrnett for evaluation of gall bladder polyps. Also his liver function tests are better. Ferritin level has decreased, iron saturation is normal now. Recommend to follow up in 4 months, please order labs prior, cbc LFT, iron tibc ferritin. Then virtual MD visit. Thanks.

## 2021-01-10 NOTE — Telephone Encounter (Signed)
Patient scheduled for lab on 6/2 and Mychart on 6/3

## 2021-03-23 IMAGING — US US SOFT TISSUE EXCLUDE HEAD/NECK
1 series · 6 of 6 positions shown · non-contrast
Comparison: None.

CLINICAL DATA: Palpable mass left chest wall

EXAM:
ULTRASOUND OF HEAD/NECK SOFT TISSUES
TECHNIQUE: Ultrasound examination of the head and neck soft tissues was
performed in the area of clinical concern.

[Series 1: us soft tissue exclude head/neck · 6 of 6 slices shown]
[im 1/6]
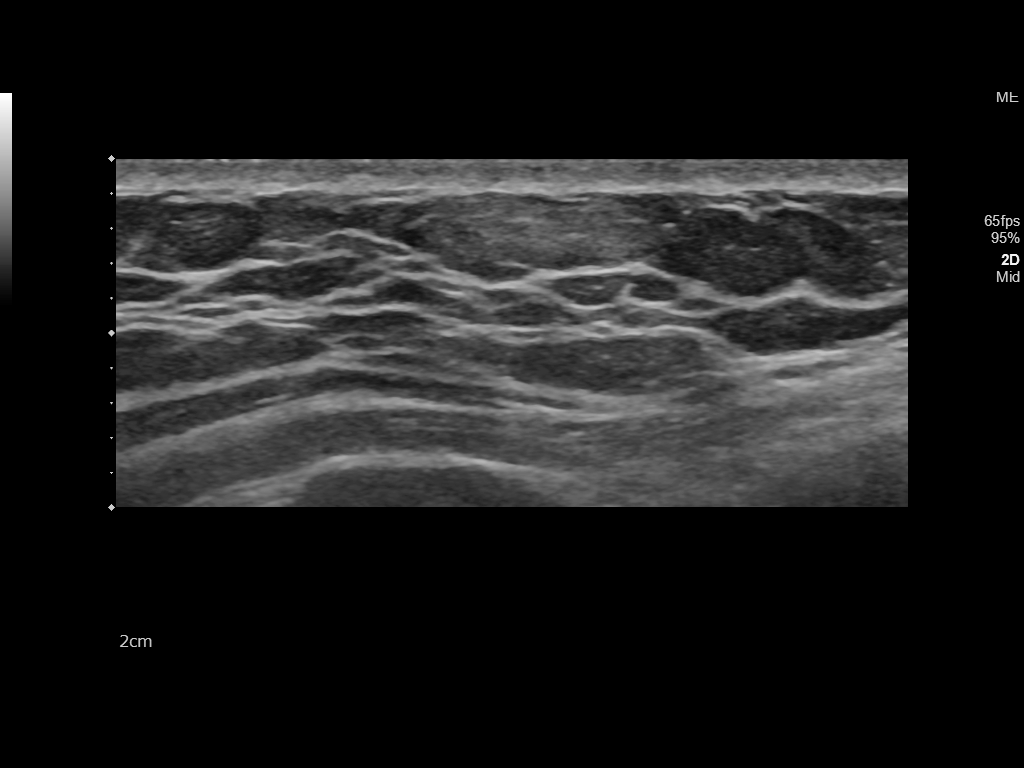
[im 2/6]
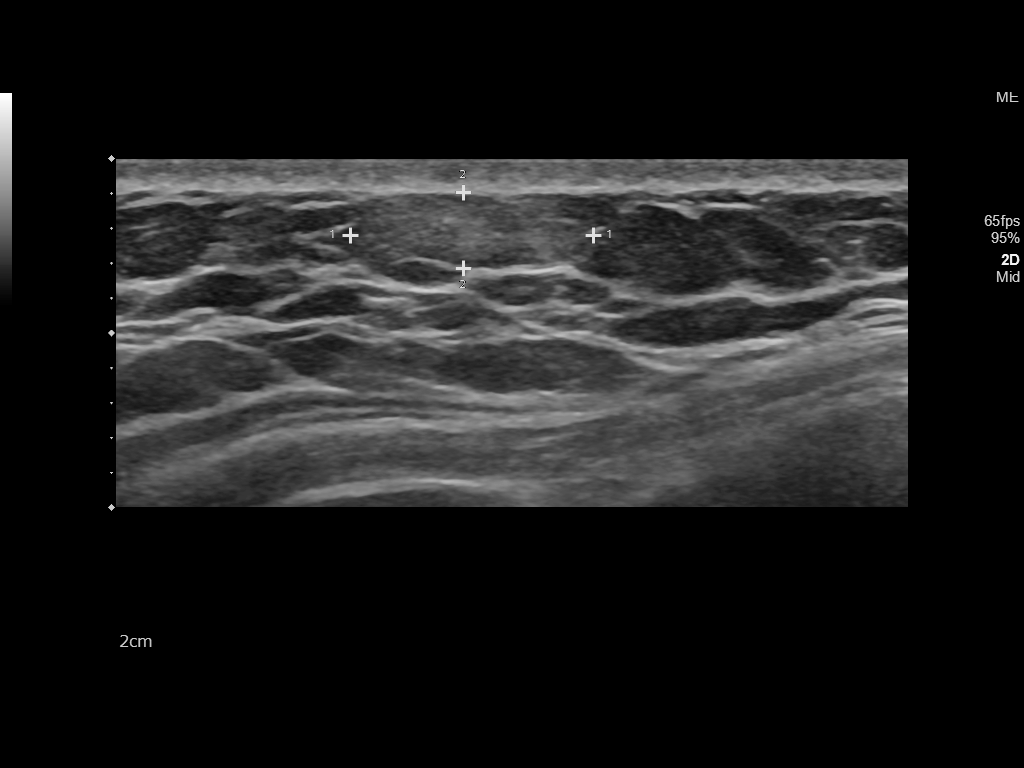
[im 3/6]
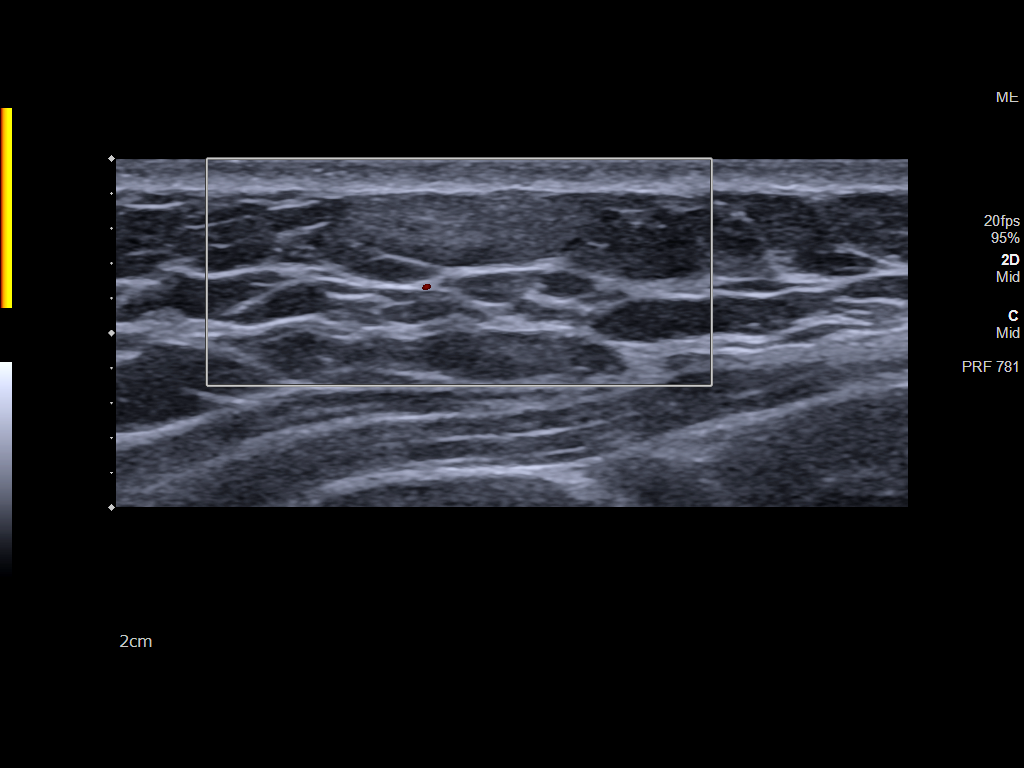
[im 4/6]
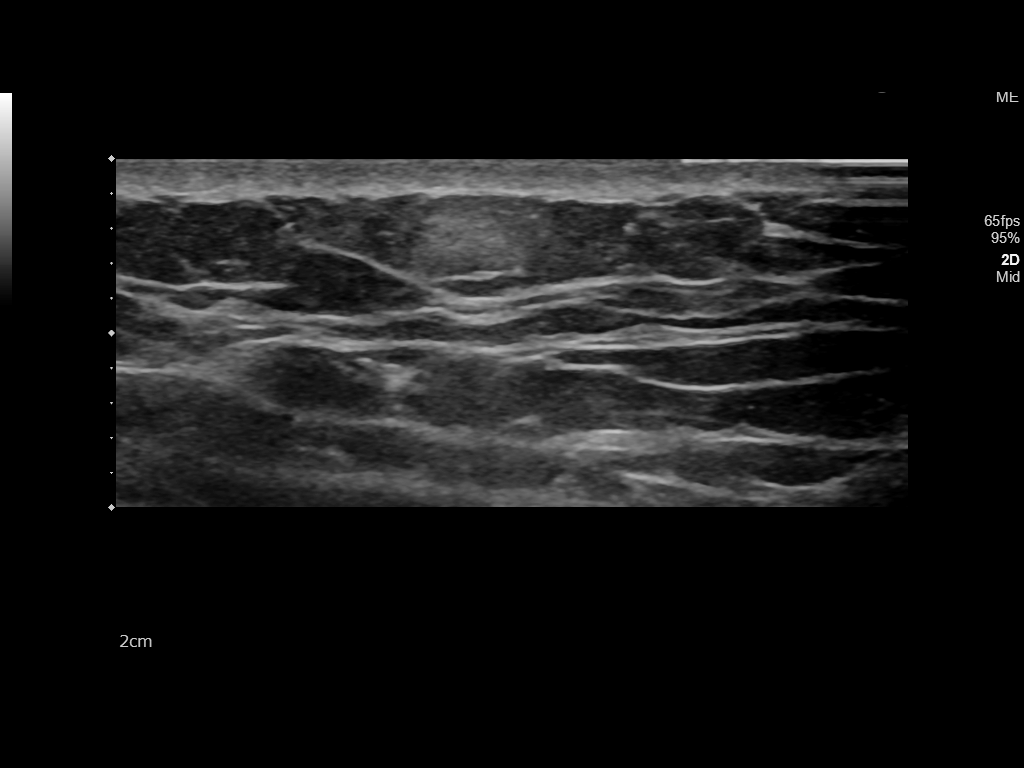
[im 5/6]
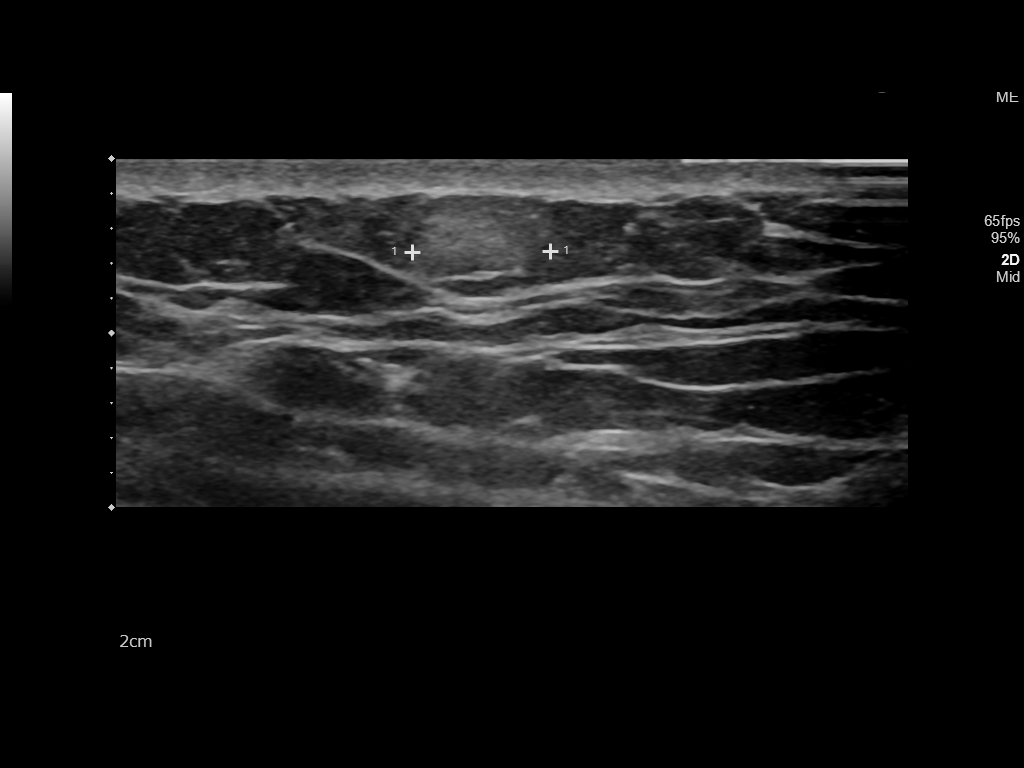
[im 6/6]
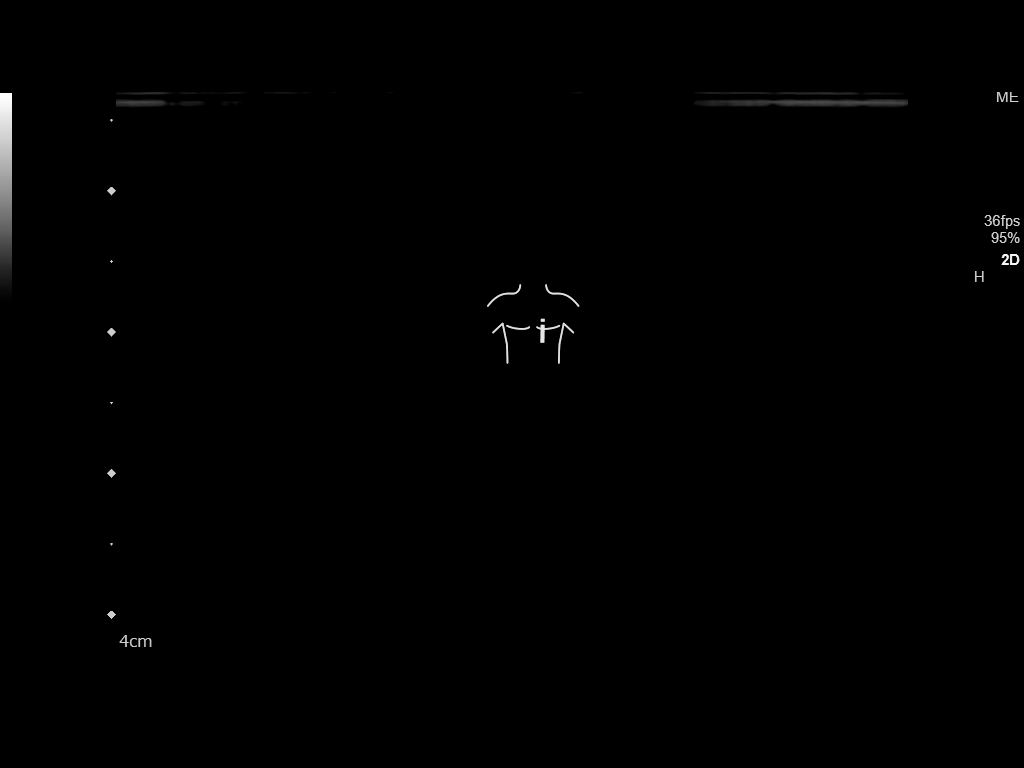

[6 of 6 positions shown; findings below may reference images not displayed]

FINDINGS: Scanning in the area of the left anterior chest demonstrates a
hyperechoic soft tissue mass in the subcutaneous tissues. The mass
measures approximately 14 x 4 x 8 mm. This is surrounded by
subcutaneous fat. No cystic component. No shadowing.
IMPRESSION: Subcutaneous mass in the left anterior chest wall most likely a
small lipoma.

## 2021-05-07 ENCOUNTER — Inpatient Hospital Stay: Payer: Managed Care, Other (non HMO) | Attending: Oncology

## 2021-05-07 ENCOUNTER — Other Ambulatory Visit: Payer: Self-pay

## 2021-05-07 DIAGNOSIS — Z148 Genetic carrier of other disease: Secondary | ICD-10-CM

## 2021-05-07 DIAGNOSIS — R7401 Elevation of levels of liver transaminase levels: Secondary | ICD-10-CM | POA: Insufficient documentation

## 2021-05-07 DIAGNOSIS — R7989 Other specified abnormal findings of blood chemistry: Secondary | ICD-10-CM | POA: Diagnosis not present

## 2021-05-07 DIAGNOSIS — K824 Cholesterolosis of gallbladder: Secondary | ICD-10-CM | POA: Diagnosis not present

## 2021-05-07 LAB — CBC WITH DIFFERENTIAL/PLATELET
Abs Immature Granulocytes: 0.01 10*3/uL (ref 0.00–0.07)
Basophils Absolute: 0.1 10*3/uL (ref 0.0–0.1)
Basophils Relative: 1 %
Eosinophils Absolute: 0.2 10*3/uL (ref 0.0–0.5)
Eosinophils Relative: 4 %
HCT: 44.2 % (ref 39.0–52.0)
Hemoglobin: 15.1 g/dL (ref 13.0–17.0)
Immature Granulocytes: 0 %
Lymphocytes Relative: 39 %
Lymphs Abs: 1.9 10*3/uL (ref 0.7–4.0)
MCH: 30.1 pg (ref 26.0–34.0)
MCHC: 34.2 g/dL (ref 30.0–36.0)
MCV: 88.2 fL (ref 80.0–100.0)
Monocytes Absolute: 0.5 10*3/uL (ref 0.1–1.0)
Monocytes Relative: 10 %
Neutro Abs: 2.2 10*3/uL (ref 1.7–7.7)
Neutrophils Relative %: 46 %
Platelets: 167 10*3/uL (ref 150–400)
RBC: 5.01 MIL/uL (ref 4.22–5.81)
RDW: 12.9 % (ref 11.5–15.5)
WBC: 4.8 10*3/uL (ref 4.0–10.5)
nRBC: 0 % (ref 0.0–0.2)

## 2021-05-07 LAB — HEPATIC FUNCTION PANEL
ALT: 31 U/L (ref 0–44)
AST: 21 U/L (ref 15–41)
Albumin: 4 g/dL (ref 3.5–5.0)
Alkaline Phosphatase: 60 U/L (ref 38–126)
Bilirubin, Direct: <0.1 mg/dL (ref 0.0–0.2)
Total Bilirubin: 0.6 mg/dL (ref 0.3–1.2)
Total Protein: 7 g/dL (ref 6.5–8.1)

## 2021-05-07 LAB — IRON AND TIBC
Iron: 83 ug/dL (ref 45–182)
Saturation Ratios: 28 % (ref 17.9–39.5)
TIBC: 301 ug/dL (ref 250–450)
UIBC: 218 ug/dL

## 2021-05-07 LAB — FERRITIN: Ferritin: 80 ng/mL (ref 24–336)

## 2021-05-08 ENCOUNTER — Other Ambulatory Visit: Payer: Self-pay

## 2021-05-08 ENCOUNTER — Inpatient Hospital Stay (HOSPITAL_BASED_OUTPATIENT_CLINIC_OR_DEPARTMENT_OTHER): Payer: Managed Care, Other (non HMO) | Admitting: Oncology

## 2021-05-08 ENCOUNTER — Inpatient Hospital Stay: Payer: Managed Care, Other (non HMO)

## 2021-05-08 DIAGNOSIS — K824 Cholesterolosis of gallbladder: Secondary | ICD-10-CM | POA: Diagnosis not present

## 2021-05-08 DIAGNOSIS — Z148 Genetic carrier of other disease: Secondary | ICD-10-CM

## 2021-05-08 DIAGNOSIS — R7401 Elevation of levels of liver transaminase levels: Secondary | ICD-10-CM

## 2021-05-09 ENCOUNTER — Inpatient Hospital Stay: Payer: Managed Care, Other (non HMO) | Admitting: Oncology

## 2021-05-10 ENCOUNTER — Encounter: Payer: Self-pay | Admitting: Oncology

## 2021-05-10 NOTE — Progress Notes (Signed)
HEMATOLOGY-ONCOLOGY TeleHEALTH VISIT PROGRESS NOTE  I connected with Kyle Velasquez on 05/10/21  at  2:15 PM EDT by video enabled telemedicine visit and verified that I am speaking with the correct person using two identifiers. I discussed the limitations, risks, security and privacy concerns of performing an evaluation and management service by telemedicine and the availability of in-person appointments. The patient expressed understanding and agreed to proceed.   Other persons participating in the visit and their role in the encounter:  None  Patient's location: Home  Provider's location: office Chief Complaint: Elevated ferritin, hemochromatosis carrier  INTERVAL HISTORY Kyle Velasquez is a 60 y.o. male who has above history reviewed by me today presents for follow up visit Patient reports doing well.  No new complaints.  He has establish care with Dr. Bary Castilla for gallbladder polyp and was recommended for observation.   Review of Systems  Constitutional: Negative for appetite change, chills, fatigue, fever and unexpected weight change.  HENT:   Negative for hearing loss and voice change.   Eyes: Negative for eye problems and icterus.  Respiratory: Negative for chest tightness, cough and shortness of breath.   Cardiovascular: Negative for chest pain and leg swelling.  Gastrointestinal: Negative for abdominal distention and abdominal pain.  Endocrine: Negative for hot flashes.  Genitourinary: Negative for difficulty urinating, dysuria and frequency.   Musculoskeletal: Negative for arthralgias.  Skin: Negative for itching and rash.  Neurological: Negative for light-headedness and numbness.  Hematological: Negative for adenopathy. Does not bruise/bleed easily.  Psychiatric/Behavioral: Negative for confusion.    Past Medical History:  Diagnosis Date  . Dyslipidemia 06/22/2012  . Epiretinal membrane (ERM) of left eye 02/2019  . Hyperlipidemia   . Iron overload 07/26/2019  . Seizures  ([place])    LAST HAD SEIZURE IN 1981   Past Surgical History:  Procedure Laterality Date  . COLONOSCOPY  08/2012   WNL, sm int hemorrhoids, rec rpt 10 yrs (Pyrtle)  . EXCISION OF SKIN TAG N/A 09/09/2015   Procedure: EXCISION OF SKIN TAG/ANAL SKIN TAGS;  Surgeon: Robert Bellow, MD;  Location: [facility] ORS;  Service: General;  Laterality: N/A;  . VASECTOMY  2010   Dr. Purvis Kilts [facility])    Family History  Problem Relation Age of Onset  . Hypertension Mother   . Hyperlipidemia Mother   . Other Father        Hypoglycemia  . HIV/AIDS Father        from blood transfusion   . Diabetes Maternal Grandfather   . Heart failure Maternal Grandmother   . Diabetes Paternal Grandfather   . Diabetes Paternal Grandmother   . Breast cancer Sister   . Breast cancer Maternal Aunt   . Coronary artery disease Paternal Uncle        MI  . Prostate cancer Maternal Uncle   . Breast cancer Paternal Aunt   . Stroke Neg Hx   . Colon cancer Neg Hx   . Stomach cancer Neg Hx     Social History   Socioeconomic History  . Marital status: Married    Spouse name: Not on file  . Number of children: 2  . Years of education: Not on file  . Highest education level: Not on file  Occupational History  . Occupation: Sales promotion account executive  Tobacco Use  . Smoking status: Former Smoker    Packs/day: 1.00    Years: 5.00    Pack years: 5.00    Types: Cigarettes    Quit date: 12/07/1986  Years since quitting: 34.4  . Smokeless tobacco: Never Used  Vaping Use  . Vaping Use: Never used  Substance and Sexual Activity  . Alcohol use: Yes    Comment: Occasional  . Drug use: No  . Sexual activity: Not on file  Other Topics Concern  . Not on file  Social History Narrative   Caffeine use/day: 2-3 cups coffee/day    Lives with wife, 2 daughters, 1 dog.    Occupation: Network engineer    Exercise - basketball weekly, walking 2 miles a day    Diet: some water, fruits/vegetables occasionally, red meat 1x/wk, fish  rarely   Social Determinants of Radio broadcast assistant Strain: Not on file  Food Insecurity: Not on file  Transportation Needs: Not on file  Physical Activity: Not on file  Stress: Not on file  Social Connections: Not on file  Intimate Partner Violence: Not on file    Current Outpatient Medications on File Prior to Visit  Medication Sig Dispense Refill  . atorvastatin (LIPITOR) 40 MG tablet Take 1 tablet by mouth daily.    . benzonatate (TESSALON) 200 MG capsule Take 200 mg by mouth 3 (three) times daily as needed.    . Coenzyme Q10 (CO Q 10) 100 MG CAPS Take 2 tablets by mouth daily.    . cyanocobalamin 2000 MCG tablet Take 5,000 mcg by mouth daily.    Marland Kitchen loratadine-pseudoephedrine (CLARITIN-D 24-HOUR) 10-240 MG per 24 hr tablet Take 1 tablet by mouth as needed.     . valACYclovir (VALTREX) 1000 MG tablet Take 2,000 mg by mouth 2 (two) times daily.    Marland Kitchen atorvastatin (LIPITOR) 20 MG tablet Take 20 mg by mouth daily. (Patient not taking: Reported on 05/08/2021)    . fluticasone (FLONASE) 50 MCG/ACT nasal spray Place into the nose. (Patient not taking: Reported on 05/08/2021)    . predniSONE (DELTASONE) 20 MG tablet Take 20 mg by mouth 2 (two) times daily. (Patient not taking: Reported on 05/08/2021)     No current facility-administered medications on file prior to visit.    No Known Allergies     Observations/Objective: Today's Vitals   05/08/21 1332  PainSc: 0-No pain   There is no height or weight on file to calculate BMI.  Physical Exam Neurological:     Mental Status: He is alert.     CBC    Component Value Date/Time   WBC 4.8 05/07/2021 0804   RBC 5.01 05/07/2021 0804   HGB 15.1 05/07/2021 0804   HCT 44.2 05/07/2021 0804   PLT 167 05/07/2021 0804   MCV 88.2 05/07/2021 0804   MCH 30.1 05/07/2021 0804   MCHC 34.2 05/07/2021 0804   RDW 12.9 05/07/2021 0804   LYMPHSABS 1.9 05/07/2021 0804   MONOABS 0.5 05/07/2021 0804   EOSABS 0.2 05/07/2021 0804   BASOSABS 0.1  05/07/2021 0804    CMP     Component Value Date/Time   NA 135 10/13/2019 0801   K 4.0 10/13/2019 0801   CL 107 10/13/2019 0801   CO2 22 10/13/2019 0801   GLUCOSE 97 10/13/2019 0801   BUN 21 (H) 10/13/2019 0801   CREATININE 1.10 10/13/2019 0801   CALCIUM 8.9 10/13/2019 0801   PROT 7.0 05/07/2021 0804   ALBUMIN 4.0 05/07/2021 0804   AST 21 05/07/2021 0804   ALT 31 05/07/2021 0804   ALKPHOS 60 05/07/2021 0804   BILITOT 0.6 05/07/2021 0804   GFRNONAA >60 10/13/2019 0801   GFRAA >60  10/13/2019 0801     Assessment and Plan: 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 Gallbladder polyp   3. Transaminitis     Heterozygous H63D mutation,  Labs are reviewed and discussed with patient Ferritin has improved. Continue observation.  No phlebotomy.  Transaminitis has also improved and normalized.  Continue monitor  #6 mm gallbladder polyp, I will defer to surgery Dr. Bary Castilla for observation and surveillance.  Follow Up Instructions: 6 months   I discussed the assessment and treatment plan with the patient. The patient was provided an opportunity to ask questions and all were answered. The patient agreed with the plan and demonstrated an understanding of the instructions.  The patient was advised to call back or seek an in-person evaluation if the symptoms worsen or if the condition fails to improve as anticipated.    Earlie Server, MD 05/10/2021 1:02 PM

## 2021-11-12 ENCOUNTER — Other Ambulatory Visit: Payer: Self-pay

## 2021-11-12 ENCOUNTER — Inpatient Hospital Stay: Payer: Managed Care, Other (non HMO) | Attending: Oncology

## 2021-11-12 ENCOUNTER — Other Ambulatory Visit: Payer: Managed Care, Other (non HMO)

## 2021-11-12 DIAGNOSIS — R7401 Elevation of levels of liver transaminase levels: Secondary | ICD-10-CM | POA: Insufficient documentation

## 2021-11-12 DIAGNOSIS — Z148 Genetic carrier of other disease: Secondary | ICD-10-CM

## 2021-11-12 DIAGNOSIS — K824 Cholesterolosis of gallbladder: Secondary | ICD-10-CM | POA: Insufficient documentation

## 2021-11-12 LAB — COMPREHENSIVE METABOLIC PANEL
ALT: 36 U/L (ref 0–44)
AST: 26 U/L (ref 15–41)
Albumin: 4.1 g/dL (ref 3.5–5.0)
Alkaline Phosphatase: 67 U/L (ref 38–126)
Anion gap: 9 (ref 5–15)
BUN: 19 mg/dL (ref 6–20)
CO2: 25 mmol/L (ref 22–32)
Calcium: 9.2 mg/dL (ref 8.9–10.3)
Chloride: 105 mmol/L (ref 98–111)
Creatinine, Ser: 1.29 mg/dL — ABNORMAL HIGH (ref 0.61–1.24)
GFR, Estimated: >60 mL/min (ref >60)
Glucose, Bld: 95 mg/dL (ref 70–99)
Potassium: 4 mmol/L (ref 3.5–5.1)
Sodium: 139 mmol/L (ref 135–145)
Total Bilirubin: 0.5 mg/dL (ref 0.3–1.2)
Total Protein: 7.2 g/dL (ref 6.5–8.1)

## 2021-11-12 LAB — CBC WITH DIFFERENTIAL/PLATELET
Abs Immature Granulocytes: 0.04 10*3/uL (ref 0.00–0.07)
Basophils Absolute: 0 10*3/uL (ref 0.0–0.1)
Basophils Relative: 1 %
Eosinophils Absolute: 0.2 10*3/uL (ref 0.0–0.5)
Eosinophils Relative: 3 %
HCT: 41.1 % (ref 39.0–52.0)
Hemoglobin: 13.9 g/dL (ref 13.0–17.0)
Immature Granulocytes: 1 %
Lymphocytes Relative: 42 %
Lymphs Abs: 2.6 10*3/uL (ref 0.7–4.0)
MCH: 30 pg (ref 26.0–34.0)
MCHC: 33.8 g/dL (ref 30.0–36.0)
MCV: 88.8 fL (ref 80.0–100.0)
Monocytes Absolute: 0.5 10*3/uL (ref 0.1–1.0)
Monocytes Relative: 8 %
Neutro Abs: 2.8 10*3/uL (ref 1.7–7.7)
Neutrophils Relative %: 45 %
Platelets: 193 10*3/uL (ref 150–400)
RBC: 4.63 MIL/uL (ref 4.22–5.81)
RDW: 13.3 % (ref 11.5–15.5)
WBC: 6.1 10*3/uL (ref 4.0–10.5)
nRBC: 0 % (ref 0.0–0.2)

## 2021-11-12 LAB — IRON AND TIBC
Iron: 95 ug/dL (ref 45–182)
Saturation Ratios: 28 % (ref 17.9–39.5)
TIBC: 343 ug/dL (ref 250–450)
UIBC: 248 ug/dL

## 2021-11-12 LAB — FERRITIN: Ferritin: 37 ng/mL (ref 24–336)

## 2021-11-13 ENCOUNTER — Encounter: Payer: Self-pay | Admitting: Oncology

## 2021-11-13 ENCOUNTER — Inpatient Hospital Stay (HOSPITAL_BASED_OUTPATIENT_CLINIC_OR_DEPARTMENT_OTHER): Payer: Managed Care, Other (non HMO) | Admitting: Oncology

## 2021-11-13 ENCOUNTER — Telehealth: Payer: Managed Care, Other (non HMO) | Admitting: Oncology

## 2021-11-13 DIAGNOSIS — R7401 Elevation of levels of liver transaminase levels: Secondary | ICD-10-CM | POA: Diagnosis not present

## 2021-11-13 DIAGNOSIS — Z148 Genetic carrier of other disease: Secondary | ICD-10-CM | POA: Diagnosis not present

## 2021-11-13 DIAGNOSIS — K824 Cholesterolosis of gallbladder: Secondary | ICD-10-CM | POA: Diagnosis not present

## 2021-11-13 NOTE — Progress Notes (Signed)
Pt contacted for Mychart visit. He reports that he recently had dental work done (root canal) and had been taking Iburofen PRN. He also states that he donated blood last Friday

## 2021-11-13 NOTE — Progress Notes (Signed)
HEMATOLOGY-ONCOLOGY TeleHEALTH VISIT PROGRESS NOTE  I connected with Kyle Velasquez on 11/13/21  at  2:15 PM EST by video enabled telemedicine visit and verified that I am speaking with the correct person using two identifiers. I discussed the limitations, risks, security and privacy concerns of performing an evaluation and management service by telemedicine and the availability of in-person appointments. The patient expressed understanding and agreed to proceed.   Other persons participating in the visit and their role in the encounter:  None  Patient's location: Home  Provider's location: office Chief Complaint: Elevated ferritin, hemochromatosis carrier  INTERVAL HISTORY Kyle Velasquez is a 60 y.o. male who has above history reviewed by me today presents for follow up visit Patient reports doing well.  No new complaints.   He had dental work done recently and has taken several doses of NSAIDs. He recently donated blood.   Review of Systems  Constitutional:  Negative for appetite change, chills, fatigue, fever and unexpected weight change.  HENT:   Negative for hearing loss and voice change.   Eyes:  Negative for eye problems and icterus.  Respiratory:  Negative for chest tightness, cough and shortness of breath.   Cardiovascular:  Negative for chest pain and leg swelling.  Gastrointestinal:  Negative for abdominal distention and abdominal pain.  Endocrine: Negative for hot flashes.  Genitourinary:  Negative for difficulty urinating, dysuria and frequency.   Musculoskeletal:  Negative for arthralgias.  Skin:  Negative for itching and rash.  Neurological:  Negative for light-headedness and numbness.  Hematological:  Negative for adenopathy. Does not bruise/bleed easily.  Psychiatric/Behavioral:  Negative for confusion.      Past Medical History:  Diagnosis Date   Dyslipidemia 06/22/2012   Epiretinal membrane (ERM) of left eye 02/2019   Hyperlipidemia    Iron overload 07/26/2019    Seizures ([place])    LAST HAD SEIZURE IN 1981   Past Surgical History:  Procedure Laterality Date   COLONOSCOPY  08/2012   WNL, sm int hemorrhoids, rec rpt 10 yrs ([facility])   EXCISION OF SKIN TAG N/A 09/09/2015   Procedure: EXCISION OF SKIN TAG/ANAL SKIN TAGS;  Surgeon: Robert Bellow, MD;  Location: [facility] ORS;  Service: General;  Laterality: N/A;   VASECTOMY  2010   Dr. Candiss Norse)    Family History  Problem Relation Age of Onset   Hypertension Mother    Hyperlipidemia Mother    Other Father        Hypoglycemia   HIV/AIDS Father        from blood transfusion    Diabetes Maternal Grandfather    Heart failure Maternal Grandmother    Diabetes Paternal Grandfather    Diabetes Paternal Grandmother    Breast cancer Sister    Breast cancer Maternal Aunt    Coronary artery disease Paternal Uncle        MI   Prostate cancer Maternal Uncle    Breast cancer Paternal Aunt    Stroke Neg Hx    Colon cancer Neg Hx    Stomach cancer Neg Hx     Social History   Socioeconomic History   Marital status: Married    Spouse name: Not on file   Number of children: 2   Years of education: Not on file   Highest education level: Not on file  Occupational History   Occupation: Sales promotion account executive  Tobacco Use   Smoking status: Former    Packs/day: 1.00    Years: 5.00    Pack  years: 5.00    Types: Cigarettes    Quit date: 12/07/1986    Years since quitting: 34.9   Smokeless tobacco: Never  Vaping Use   Vaping Use: Never used  Substance and Sexual Activity   Alcohol use: Yes    Comment: Occasional   Drug use: No   Sexual activity: Not on file  Other Topics Concern   Not on file  Social History Narrative   Caffeine use/day: 2-3 cups coffee/day    Lives with wife, 2 daughters, 1 dog.    Occupation: Network engineer    Exercise - basketball weekly, walking 2 miles a day    Diet: some water, fruits/vegetables occasionally, red meat 1x/wk, fish rarely   Social Determinants of  Radio broadcast assistant Strain: Not on file  Food Insecurity: Not on file  Transportation Needs: Not on file  Physical Activity: Not on file  Stress: Not on file  Social Connections: Not on file  Intimate Partner Violence: Not on file    Current Outpatient Medications on File Prior to Visit  Medication Sig Dispense Refill   atorvastatin (LIPITOR) 40 MG tablet Take 1 tablet by mouth daily.     Cetirizine HCl (ZYRTEC ALLERGY) 10 MG CAPS      Coenzyme Q10 (CO Q 10) 100 MG CAPS Take 2 tablets by mouth daily.     cyanocobalamin 2000 MCG tablet Take 5,000 mcg by mouth daily.     fluticasone (FLONASE) 50 MCG/ACT nasal spray Place into the nose.     loratadine-pseudoephedrine (CLARITIN-D 24-HOUR) 10-240 MG 24 hr tablet      No current facility-administered medications on file prior to visit.    No Known Allergies     Observations/Objective: Today's Vitals   11/13/21 1351  PainSc: 0-No pain   There is no height or weight on file to calculate BMI.  Physical Exam Neurological:     Mental Status: He is alert.    CBC    Component Value Date/Time   WBC 6.1 11/12/2021 1415   RBC 4.63 11/12/2021 1415   HGB 13.9 11/12/2021 1415   HCT 41.1 11/12/2021 1415   PLT 193 11/12/2021 1415   MCV 88.8 11/12/2021 1415   MCH 30.0 11/12/2021 1415   MCHC 33.8 11/12/2021 1415   RDW 13.3 11/12/2021 1415   LYMPHSABS 2.6 11/12/2021 1415   MONOABS 0.5 11/12/2021 1415   EOSABS 0.2 11/12/2021 1415   BASOSABS 0.0 11/12/2021 1415    CMP     Component Value Date/Time   NA 139 11/12/2021 1415   K 4.0 11/12/2021 1415   CL 105 11/12/2021 1415   CO2 25 11/12/2021 1415   GLUCOSE 95 11/12/2021 1415   BUN 19 11/12/2021 1415   CREATININE 1.29 (H) 11/12/2021 1415   CALCIUM 9.2 11/12/2021 1415   PROT 7.2 11/12/2021 1415   ALBUMIN 4.1 11/12/2021 1415   AST 26 11/12/2021 1415   ALT 36 11/12/2021 1415   ALKPHOS 67 11/12/2021 1415   BILITOT 0.5 11/12/2021 1415   GFRNONAA >60 11/12/2021 1415   GFRAA  >60 10/13/2019 0801     Assessment and Plan: 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 Gallbladder polyp   3. Transaminitis     Heterozygous H63D mutation Labs are reviewed and discussed with patient. Hb 13.9, TIBC 28, ferritin 37 Discussed with patient that heterozygous hemochromatosis patients usually do not develop iron over load symptoms and do not need therapeutic phlebotomy.  He may continue blood donation, but not for  theraputic purpose.  Avoid ETOH, iron supplementation, vitamin C supplementation.   Elevated creatine, eGFR >60. Recommend patient to use NSAIDs with caution. Encourage oral hydration.   Transaminitis has also improved and normalized.  Continue monitor  #6 mm gallbladder polyp, follow up with Dr. Bary Castilla for observation and surveillance.  Follow Up Instructions: 6 months   I discussed the assessment and treatment plan with the patient. The patient was provided an opportunity to ask questions and all were answered. The patient agreed with the plan and demonstrated an understanding of the instructions.  The patient was advised to call back or seek an in-person evaluation if the symptoms worsen or if the condition fails to improve as anticipated.    Earlie Server, MD 11/13/2021 8:36 PM

## 2021-12-12 ENCOUNTER — Encounter: Payer: Self-pay | Admitting: Oncology

## 2022-02-27 ENCOUNTER — Other Ambulatory Visit: Payer: Self-pay

## 2022-02-27 DIAGNOSIS — Z1211 Encounter for screening for malignant neoplasm of colon: Secondary | ICD-10-CM

## 2022-02-27 MED ORDER — NA SULFATE-K SULFATE-MG SULF 17.5-3.13-1.6 GM/177ML PO SOLN: 1.0000 | Freq: Once | ORAL | 0 refills | Status: AC

## 2022-02-27 NOTE — Progress Notes (Signed)
Gastroenterology Pre-Procedure Review ? ?Request Date: 04/14/2022 ?Requesting Physician: Dr. Allen Norris ? ?PATIENT REVIEW QUESTIONS: The patient responded to the following health history questions as indicated:   ? ?1. Are you having any GI issues? no ?2. Do you have a personal history of Polyps? no ?3. Do you have a family history of Colon Cancer or Polyps? no ?4. Diabetes Mellitus? no ?5. Joint replacements in the past 12 months?no ?6. Major health problems in the past 3 months?no ?7. Any artificial heart valves, MVP, or defibrillator?no ?   ?MEDICATIONS & ALLERGIES:    ?Patient reports the following regarding taking any anticoagulation/antiplatelet therapy:   ?Plavix, Coumadin, Eliquis, Xarelto, Lovenox, Pradaxa, Brilinta, or Effient? no ?Aspirin? no ? ?Patient confirms/reports the following medications:  ?Current Outpatient Medications  ?Medication Sig Dispense Refill  ? atorvastatin (LIPITOR) 40 MG tablet Take 1 tablet by mouth daily.    ? Cetirizine HCl (ZYRTEC ALLERGY) 10 MG CAPS     ? Coenzyme Q10 (CO Q 10) 100 MG CAPS Take 2 tablets by mouth daily.    ? cyanocobalamin 2000 MCG tablet Take 5,000 mcg by mouth daily.    ? fluticasone (FLONASE) 50 MCG/ACT nasal spray Place into the nose.    ? loratadine-pseudoephedrine (CLARITIN-D 24-HOUR) 10-240 MG 24 hr tablet     ? ?No current facility-administered medications for this visit.  ? ? ?Patient confirms/reports the following allergies:  ?No Known Allergies ? ?No orders of the defined types were placed in this encounter. ? ? ?AUTHORIZATION INFORMATION ?Primary Insurance: ?1D#: ?Group #: ? ?Secondary Insurance: ?1D#: ?Group #: ? ?SCHEDULE INFORMATION: ?Date: 04/14/2022 ?Time: ?Location:[facility] ? ? ?

## 2022-03-11 ENCOUNTER — Telehealth: Payer: Self-pay

## 2022-03-11 NOTE — Telephone Encounter (Signed)
Pt states his insurance was not able to pull up [facility] so he would like to cancel procedure  ? ?Procedure canceled  ?

## 2022-03-23 IMAGING — US US ABDOMEN LIMITED
1 series · 14 of 25 positions shown · non-contrast
Comparison: 01/02/2020

CLINICAL DATA: Hemochromatosis

Transaminitis
EXAM:
ULTRASOUND ABDOMEN LIMITED RIGHT UPPER QUADRANT

[Series 1: us abdomen limited ruq (liver/gb) · 14 of 59 slices shown]
[im 1/59]
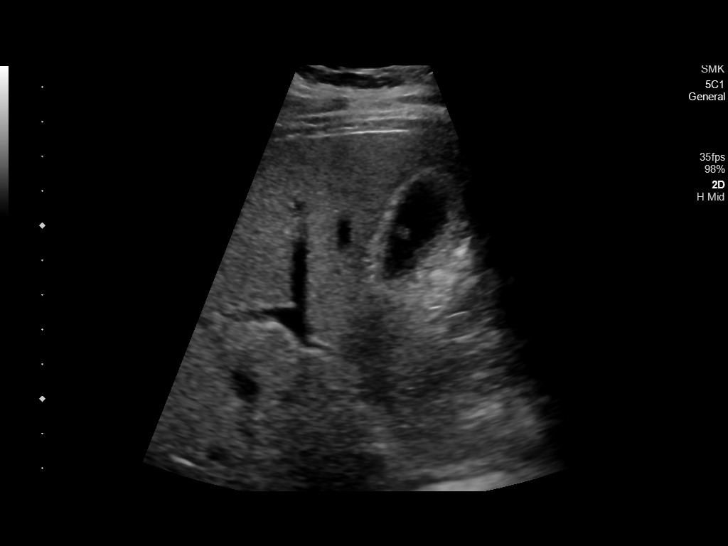
[im 5/59]
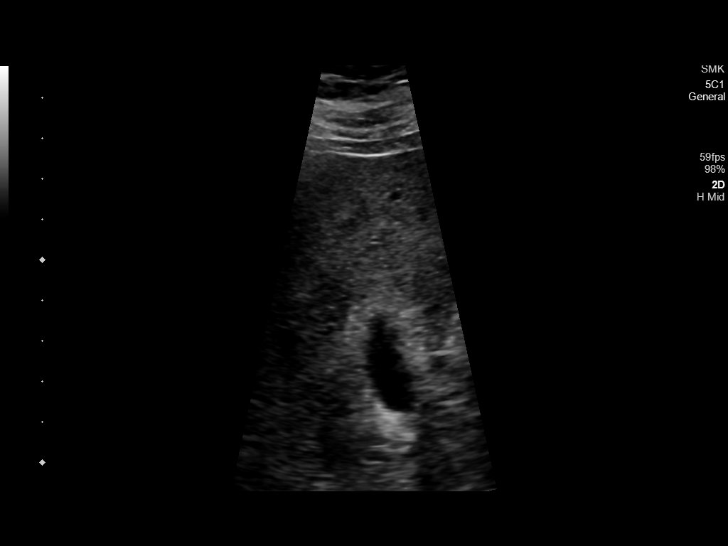
[im 10/59]
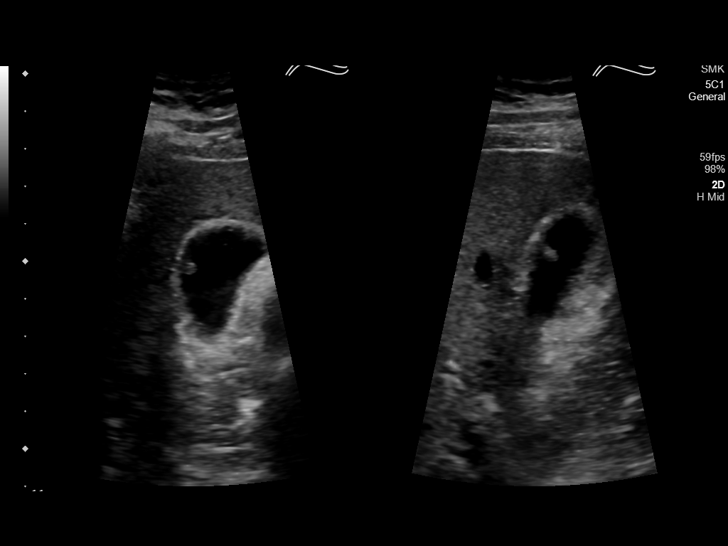
[im 15/59]
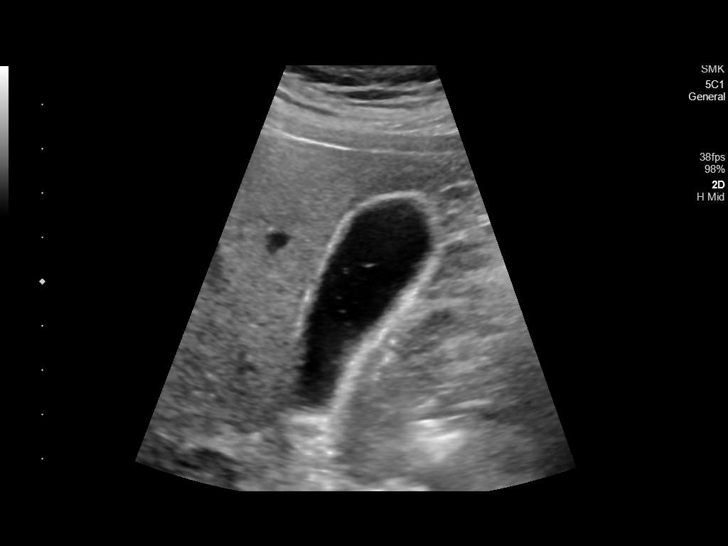
[im 20/59]
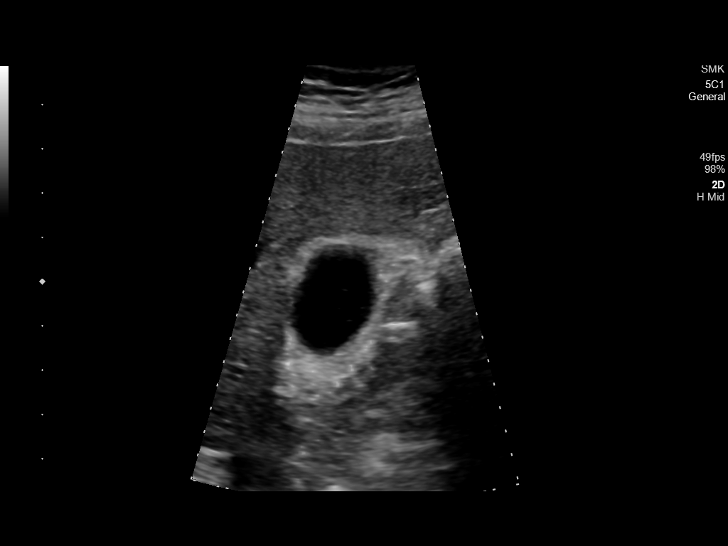
[im 22/59]
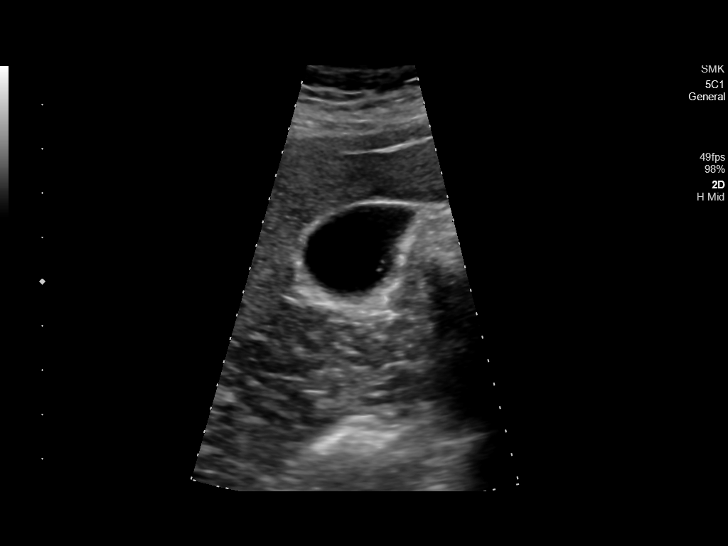
[im 27/59]
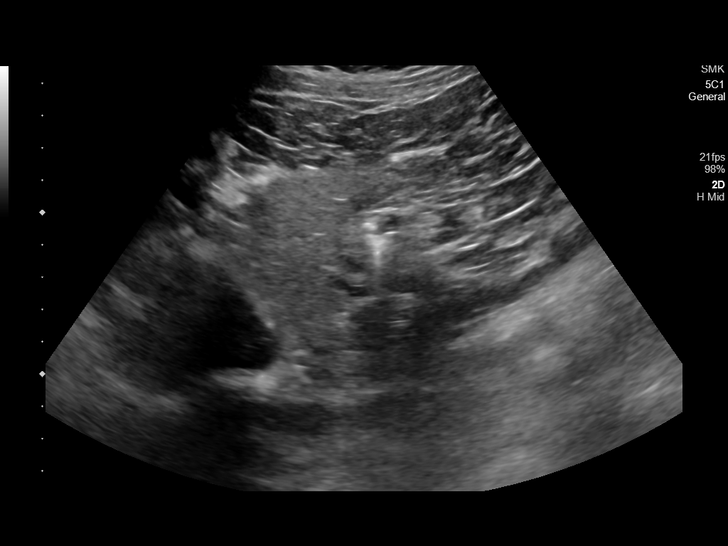
[im 32/59]
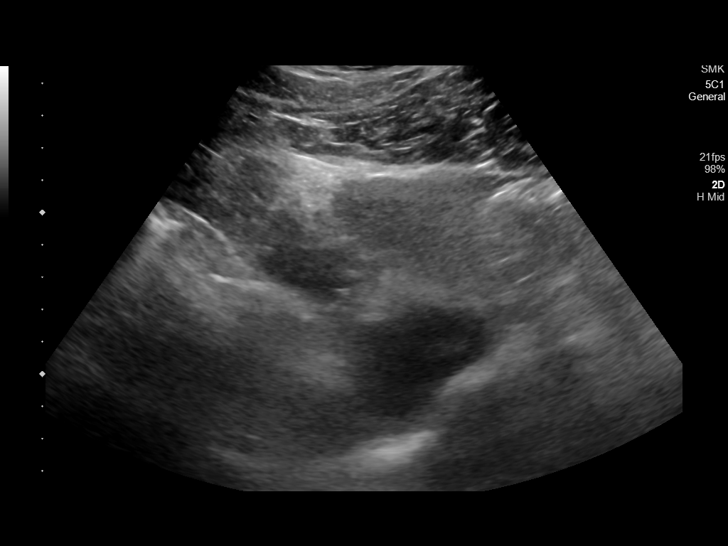
[im 37/59]
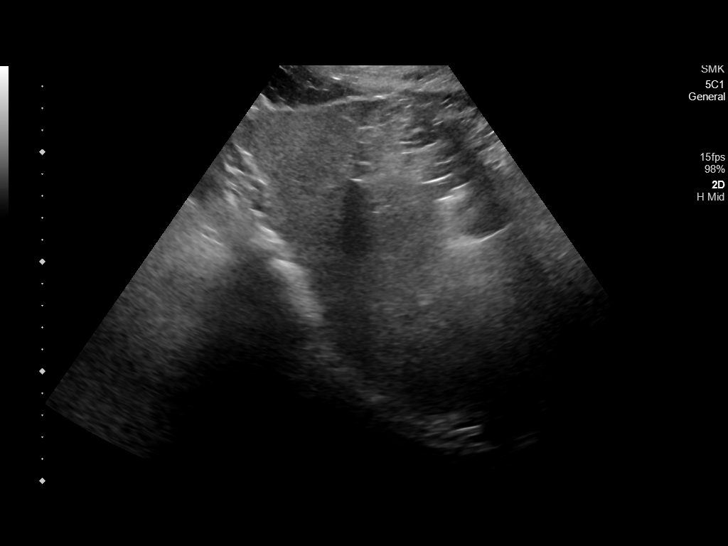
[im 39/59]
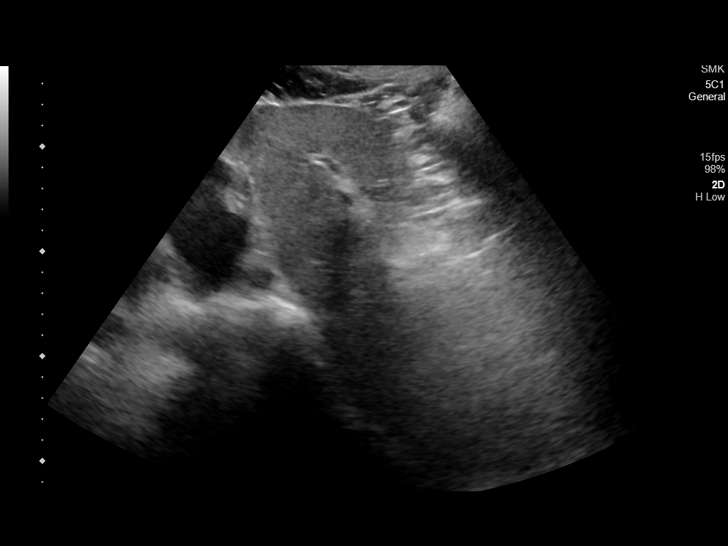
[im 44/59]
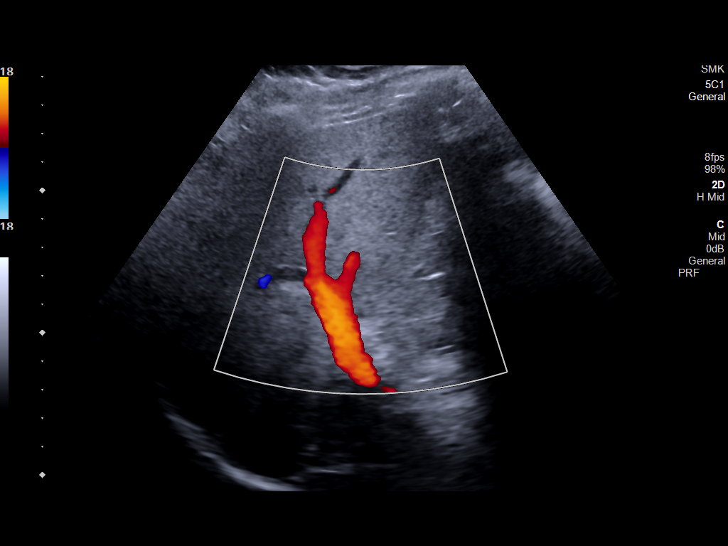
[im 49/59]
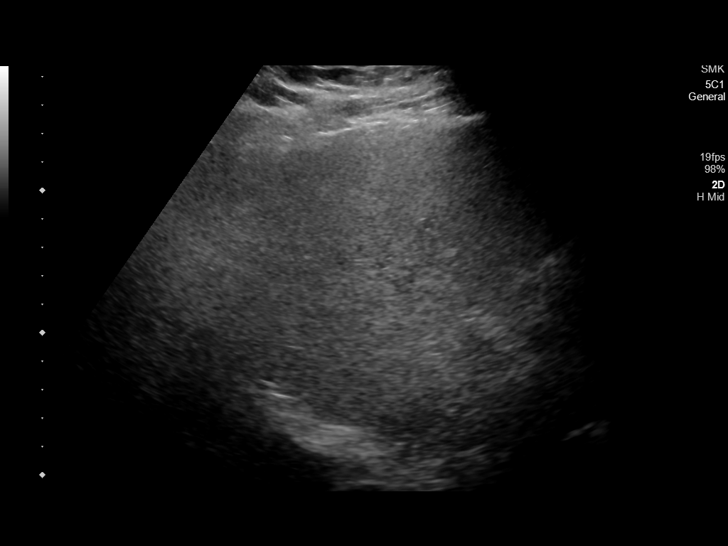
[im 54/59]
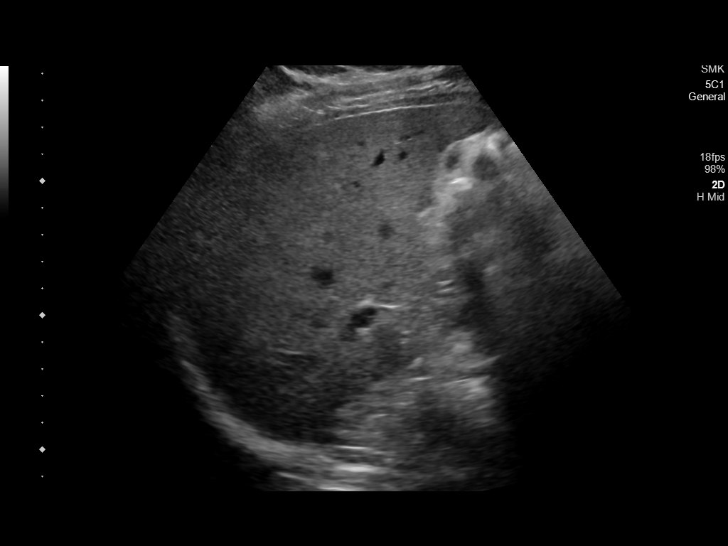
[im 59/59]
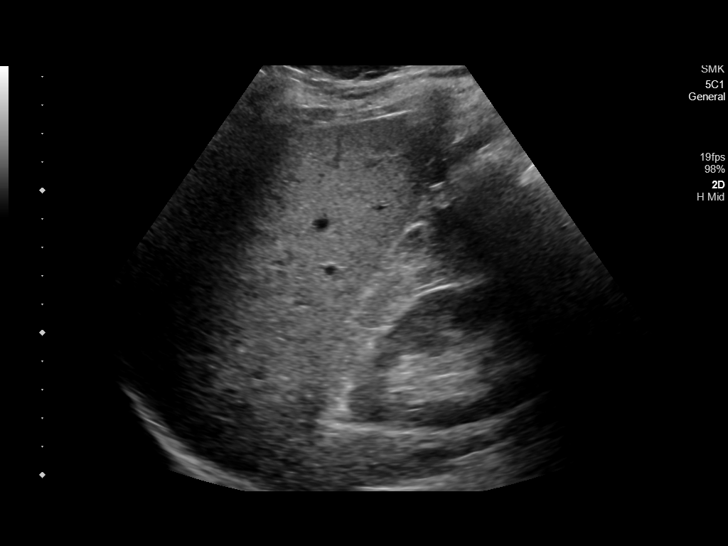

[14 of 25 positions shown; findings below may reference images not displayed]

FINDINGS: Gallbladder:

No gallstones. No gallbladder wall thickening. No pericholecystic
fluid. Sonographic Murphy sign negative per technologist.

6 mm follow-up again seen.  Is not significantly changed in size.

Common bile duct:

Diameter: 4 mm

Liver:

No focal lesion identified. Within normal limits in parenchymal
echogenicity. Portal vein is patent on color Doppler imaging with
normal direction of blood flow towards the liver.

Other: None.
IMPRESSION: Unchanged 6 mm gallbladder polyp. No significant abnormality of the
liver.

## 2022-04-14 ENCOUNTER — Ambulatory Visit: Admit: 2022-04-14 | Payer: Managed Care, Other (non HMO) | Admitting: Gastroenterology

## 2022-04-14 SURGERY — COLONOSCOPY WITH PROPOFOL
Anesthesia: General

## 2022-05-06 ENCOUNTER — Encounter: Payer: Self-pay | Admitting: Oncology

## 2022-05-13 ENCOUNTER — Inpatient Hospital Stay: Payer: Commercial Managed Care - HMO | Attending: Oncology

## 2022-05-13 DIAGNOSIS — K824 Cholesterolosis of gallbladder: Secondary | ICD-10-CM | POA: Insufficient documentation

## 2022-05-13 DIAGNOSIS — Z148 Genetic carrier of other disease: Secondary | ICD-10-CM

## 2022-05-13 LAB — CBC WITH DIFFERENTIAL/PLATELET
Abs Immature Granulocytes: 0.02 10*3/uL (ref 0.00–0.07)
Basophils Absolute: 0 10*3/uL (ref 0.0–0.1)
Basophils Relative: 1 %
Eosinophils Absolute: 0.2 10*3/uL (ref 0.0–0.5)
Eosinophils Relative: 3 %
HCT: 43.5 % (ref 39.0–52.0)
Hemoglobin: 14.7 g/dL (ref 13.0–17.0)
Immature Granulocytes: 0 %
Lymphocytes Relative: 43 %
Lymphs Abs: 2.4 10*3/uL (ref 0.7–4.0)
MCH: 30.2 pg (ref 26.0–34.0)
MCHC: 33.8 g/dL (ref 30.0–36.0)
MCV: 89.5 fL (ref 80.0–100.0)
Monocytes Absolute: 0.5 10*3/uL (ref 0.1–1.0)
Monocytes Relative: 8 %
Neutro Abs: 2.5 10*3/uL (ref 1.7–7.7)
Neutrophils Relative %: 45 %
Platelets: 173 10*3/uL (ref 150–400)
RBC: 4.86 MIL/uL (ref 4.22–5.81)
RDW: 13.3 % (ref 11.5–15.5)
WBC: 5.5 10*3/uL (ref 4.0–10.5)
nRBC: 0 % (ref 0.0–0.2)

## 2022-05-13 LAB — FERRITIN: Ferritin: 49 ng/mL (ref 24–336)

## 2022-05-13 LAB — COMPREHENSIVE METABOLIC PANEL
ALT: 44 U/L (ref 0–44)
AST: 27 U/L (ref 15–41)
Albumin: 3.9 g/dL (ref 3.5–5.0)
Alkaline Phosphatase: 68 U/L (ref 38–126)
Anion gap: 7 (ref 5–15)
BUN: 20 mg/dL (ref 6–20)
CO2: 24 mmol/L (ref 22–32)
Calcium: 8.7 mg/dL — ABNORMAL LOW (ref 8.9–10.3)
Chloride: 106 mmol/L (ref 98–111)
Creatinine, Ser: 1.15 mg/dL (ref 0.61–1.24)
GFR, Estimated: >60 mL/min (ref >60)
Glucose, Bld: 100 mg/dL — ABNORMAL HIGH (ref 70–99)
Potassium: 3.7 mmol/L (ref 3.5–5.1)
Sodium: 137 mmol/L (ref 135–145)
Total Bilirubin: 0.5 mg/dL (ref 0.3–1.2)
Total Protein: 7.1 g/dL (ref 6.5–8.1)

## 2022-05-13 LAB — IRON AND TIBC
Iron: 137 ug/dL (ref 45–182)
Saturation Ratios: 43 % — ABNORMAL HIGH (ref 17.9–39.5)
TIBC: 321 ug/dL (ref 250–450)
UIBC: 184 ug/dL

## 2022-05-14 ENCOUNTER — Inpatient Hospital Stay: Payer: Commercial Managed Care - HMO | Admitting: Oncology

## 2022-05-15 ENCOUNTER — Inpatient Hospital Stay (HOSPITAL_BASED_OUTPATIENT_CLINIC_OR_DEPARTMENT_OTHER): Payer: Commercial Managed Care - HMO | Admitting: Oncology

## 2022-05-15 ENCOUNTER — Encounter: Payer: Self-pay | Admitting: Oncology

## 2022-05-15 VITALS — BP 121/92 | HR 55 | Temp 96.1°F | Resp 18 | Wt 200.1 lb

## 2022-05-15 DIAGNOSIS — Z809 Family history of malignant neoplasm, unspecified: Secondary | ICD-10-CM

## 2022-05-15 DIAGNOSIS — K824 Cholesterolosis of gallbladder: Secondary | ICD-10-CM

## 2022-05-15 DIAGNOSIS — Z148 Genetic carrier of other disease: Secondary | ICD-10-CM | POA: Diagnosis not present

## 2022-05-15 NOTE — Progress Notes (Unsigned)
Pt here for follow up.  Currently on antibiotic for tooth infection

## 2022-05-17 ENCOUNTER — Encounter: Payer: Self-pay | Admitting: Oncology

## 2022-05-17 DIAGNOSIS — K824 Cholesterolosis of gallbladder: Secondary | ICD-10-CM | POA: Insufficient documentation

## 2022-05-17 DIAGNOSIS — Z809 Family history of malignant neoplasm, unspecified: Secondary | ICD-10-CM | POA: Insufficient documentation

## 2022-05-17 DIAGNOSIS — Z148 Genetic carrier of other disease: Secondary | ICD-10-CM | POA: Insufficient documentation

## 2022-05-17 NOTE — Assessment & Plan Note (Signed)
Family history of breast cancer, prostate cancer, previously discussed with patient about genetic testing.  Patient declined.

## 2022-05-17 NOTE — Assessment & Plan Note (Signed)
01/02/2020  ultrasound right upper quadrant showed similar appearance gallbladder polyps 22m. Dr.Byrnett recommends no surgery intervention, observation.

## 2022-05-17 NOTE — Progress Notes (Signed)
Hematology/Oncology Progress note [phone] [phone]     Patient Care Team: Kirk Ruths, MD as PCP - General (Internal Medicine) Bary Castilla Forest Gleason, MD as Consulting Physician (General Surgery) Ria Bush, MD as Referring Physician (Family Medicine)   Name of the patient: Kyle Velasquez  [id number]  1961/05/10   REASON FOR VISIT  follow-up for hemochromatosis/iron overload management.   Assessment and Plan.   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 Kyle Velasquez bladder polyp 01/02/2020  ultrasound right upper quadrant showed similar appearance gallbladder polyps 40m. Dr.Byrnett recommends no surgery intervention, observation.    Family history of cancer Family history of breast cancer, prostate cancer, previously discussed with patient about genetic testing.  Patient declined.   Orders Placed This Encounter  Procedures   Ferritin    Standing Status:   Future    Standing Expiration Date:   11/16/2022   CBC with Differential/Platelet    Standing Status:   Future    Standing Expiration Date:   05/18/2023   Iron and TIBC    Standing Status:   Future    Standing Expiration Date:   05/18/2023   Hepatic function panel    Standing Status:   Future    Standing Expiration Date:   05/17/2023   Follow up in 6 months  ZEarlie Server MD, PhD [facility] 05/15/2022     PERTINENT HEMATOLOGY HISTORY Ferritin level was elevated at 628 on 02/22/2019. he denies any family history of hemochromatosis or anyone with condition needs frequent blood removal. Denies alcohol use 08/27/2019 colonoscopy done on  which showed normal colon and a small internal hemorrhoids. He is on Atorvastatin 40 mg  INTERVAL HISTORY Kyle Velasquez a 61y.o. male who has above history reviewed by me today presents for  follow up visit for management of heterozygous hemochromatosis, elevated ferritin, breast lump. He is on antibiotics for tooth infection.  No new complaints.      Review of Systems  Constitutional:  Negative for appetite change, chills, fatigue, fever and unexpected weight change.  HENT:   Negative for hearing loss and voice change.   Eyes:  Negative for eye problems and icterus.  Respiratory:  Negative for chest tightness, cough and shortness of breath.   Cardiovascular:  Negative for chest pain and leg swelling.  Gastrointestinal:  Negative for abdominal distention and abdominal pain.  Endocrine: Negative for hot flashes.  Genitourinary:  Negative for difficulty urinating, dysuria and frequency.   Musculoskeletal:  Negative for arthralgias.  Skin:  Negative for itching and rash.  Neurological:  Negative for light-headedness and numbness.  Hematological:  Negative for adenopathy. Does not bruise/bleed easily.  Psychiatric/Behavioral:  Negative for confusion.      No Known Allergies   Past Medical History:  Diagnosis Date   Dyslipidemia 06/22/2012   Epiretinal membrane (ERM) of left eye 02/2019   Hyperlipidemia    Iron overload 07/26/2019   [place]izures [place]    LAST HAD SEIZURE IN 1981     Past Surgical History:  Procedure Laterality Date   COLONOSCOPY  08/2012   WNL, sm int hemorrhoids, rec rpt 10 yrs ([facility])   EXCISION OF SKIN TAG N/A 09/09/2015   Procedure: EXCISION OF SKIN TAG/ANAL SKIN TAGS;  Surgeon: JRobert Bellow MD;  Location: [facility] ORS;  Service: General;  Laterality: N/A;   VASECTOMY  2010   Dr.  Purvis Kilts [facility])    Social History   Socioeconomic History   Marital status: Married    Spouse name: Not on file   Number of children: 2   Years of education: Not on file   Highest education level: Not on file  Occupational History   Occupation: Sales promotion account executive  Tobacco Use   Smoking status: Former    Packs/day: 1.00    Years: 5.00    Total pack years: 5.00     Types: Cigarettes    Quit date: 12/07/1986    Years since quitting: 35.4   Smokeless tobacco: Never  Vaping Use   Vaping Use: Never used  Substance and Sexual Activity   Alcohol use: Yes    Comment: Occasional   Drug use: No   Sexual activity: Not on file  Other Topics Concern   Not on file  Social History Narrative   Caffeine use/day: 2-3 cups coffee/day    Lives with wife, 2 daughters, 1 dog.    Occupation: Network engineer    Exercise - basketball weekly, walking 2 miles a day    Diet: some water, fruits/vegetables occasionally, red meat 1x/wk, fish rarely   Social Determinants of Radio broadcast assistant Strain: Not on file  Food Insecurity: Not on file  Transportation Needs: Not on file  Physical Activity: Not on file  Stress: Not on file  Social Connections: Not on file  Intimate Partner Violence: Not on file    Family History  Problem Relation Age of Onset   Hypertension Mother    Hyperlipidemia Mother    Other Father        Hypoglycemia   HIV/AIDS Father        from blood transfusion    Diabetes Maternal Grandfather    Heart failure Maternal Grandmother    Diabetes Paternal Grandfather    Diabetes Paternal Grandmother    Breast cancer Sister    Breast cancer Maternal Aunt    Coronary artery disease Paternal Uncle        MI   Prostate cancer Maternal Uncle    Breast cancer Paternal Aunt    Stroke Neg Hx    Colon cancer Neg Hx    Stomach cancer Neg Hx      Current Outpatient Medications:    amoxicillin (AMOXIL) 500 MG tablet, Take 500 mg by mouth every 8 (eight) hours., Disp: , Rfl:    atorvastatin (LIPITOR) 40 MG tablet, Take 1 tablet by mouth daily., Disp: , Rfl:    Cholecalciferol (VITAMIN D3 PO), Take 1 tablet by mouth daily., Disp: , Rfl:    Coenzyme Q10 (CO Q 10) 100 MG CAPS, Take 2 tablets by mouth daily., Disp: , Rfl:    cyanocobalamin 2000 MCG tablet, Take 5,000 mcg by mouth daily., Disp: , Rfl:    loratadine-pseudoephedrine  (CLARITIN-D 24-HOUR) 10-240 MG 24 hr tablet, , Disp: , Rfl:    Probiotic Product (PROBIOTIC-10 PO), Take 1 tablet by mouth daily., Disp: , Rfl:    Zinc Sulfate (ZINC 15 PO), Take 1 tablet by mouth daily., Disp: , Rfl:   Physical exam:  Vitals:   05/15/22 1212  BP: (!) 121/92  Pulse: (!) 55  Resp: 18  Temp: (!) 96.1 F (35.6 C)  Weight: 200 lb 1.6 oz (90.8 kg)   Physical Exam Constitutional:      General: He is not in acute distress. HENT:     Head: Normocephalic and atraumatic.  Eyes:     General:  No scleral icterus.    Pupils: Pupils are equal, round, and reactive to light.  Cardiovascular:     Rate and Rhythm: Normal rate and regular rhythm.     Heart sounds: Normal heart sounds.  Pulmonary:     Effort: Pulmonary effort is normal. No respiratory distress.     Breath sounds: No wheezing.  Abdominal:     General: Bowel sounds are normal. There is no distension.     Palpations: Abdomen is soft. There is no mass.     Tenderness: There is no abdominal tenderness.  Musculoskeletal:        General: No deformity. Normal range of motion.     Cervical back: Normal range of motion and neck supple.  Skin:    General: Skin is warm and dry.     Findings: No erythema or rash.  Neurological:     Mental Status: He is alert and oriented to person, place, and time.     Cranial Nerves: No cranial nerve deficit.     Coordination: Coordination normal.  Psychiatric:        Behavior: Behavior normal.        Thought Content: Thought content normal.        Latest Ref Rng & Units 05/13/2022    1:12 PM  CMP  Glucose 70 - 99 mg/dL 100   BUN 6 - 20 mg/dL 20   Creatinine 0.61 - 1.24 mg/dL 1.15   Sodium 135 - 145 mmol/L 137   Potassium 3.5 - 5.1 mmol/L 3.7   Chloride 98 - 111 mmol/L 106   CO2 22 - 32 mmol/L 24   Calcium 8.9 - 10.3 mg/dL 8.7   Total Protein 6.5 - 8.1 g/dL 7.1   Total Bilirubin 0.3 - 1.2 mg/dL 0.5   Alkaline Phos 38 - 126 U/L 68   AST 15 - 41 U/L 27   ALT 0 - 44 U/L 44        Latest Ref Rng & Units 05/13/2022    1:12 PM  CBC  WBC 4.0 - 10.5 K/uL 5.5   Hemoglobin 13.0 - 17.0 g/dL 14.7   Hematocrit 39.0 - 52.0 % 43.5   Platelets 150 - 400 K/uL 173     RADIOGRAPHIC STUDIES: I have personally reviewed the radiological images as listed and agreed with the findings in the report. No results found.

## 2022-05-17 NOTE — Assessment & Plan Note (Signed)
Heterozygous H63D mutation,  Labs are reviewed and discussed with patient ferritin level at 49, increased iron saturation 43 Recommend observation. Marland Kitchen

## 2022-06-12 ENCOUNTER — Telehealth: Payer: Self-pay | Admitting: Internal Medicine

## 2022-06-12 NOTE — Telephone Encounter (Signed)
Good Afternoon Dr. Hilarie Fredrickson,   Patient called stating that he had a referral in to have a colonoscopy. Patient stated that he was last seen at [facility] in 2022 but they did not accept is insurance and did not have any procedures with them. Patient is due for a recall colonoscopy in September and patient is wanting to be seen by you again. Will you please review and advise on scheduling?  Thank you.

## 2022-06-15 ENCOUNTER — Encounter: Payer: Self-pay | Admitting: Internal Medicine

## 2022-06-15 NOTE — Telephone Encounter (Signed)
Patient was scheduled for PV on 8/31 at 1:000 and colonoscopy on 9/15 at 8:30

## 2022-06-15 NOTE — Telephone Encounter (Signed)
ok 

## 2022-08-06 ENCOUNTER — Ambulatory Visit (AMBULATORY_SURGERY_CENTER): Payer: Commercial Managed Care - HMO | Admitting: *Deleted

## 2022-08-06 ENCOUNTER — Encounter: Payer: Self-pay | Admitting: Internal Medicine

## 2022-08-06 VITALS — Ht 65.5 in | Wt 190.0 lb

## 2022-08-06 DIAGNOSIS — Z1211 Encounter for screening for malignant neoplasm of colon: Secondary | ICD-10-CM

## 2022-08-06 NOTE — Progress Notes (Signed)
Patient and wife is here in-person for PV. Patient denies any allergies to eggs or soy. Patient denies any problems with anesthesia/sedation. Patient is not on any oxygen at home. Patient is not taking any diet/weight loss medications or blood thinners. Went over procedure prep instructions with the patient. Patient is aware of our care-partner policy. Patient has suprep.

## 2022-08-16 ENCOUNTER — Encounter: Payer: Self-pay | Admitting: Certified Registered Nurse Anesthetist

## 2022-08-21 ENCOUNTER — Encounter: Payer: Self-pay | Admitting: Internal Medicine

## 2022-08-21 ENCOUNTER — Ambulatory Visit (AMBULATORY_SURGERY_CENTER): Payer: Commercial Managed Care - HMO | Admitting: Internal Medicine

## 2022-08-21 VITALS — BP 111/79 | HR 62 | Temp 96.0°F | Resp 22 | Ht 65.0 in | Wt 190.0 lb

## 2022-08-21 DIAGNOSIS — D125 Benign neoplasm of sigmoid colon: Secondary | ICD-10-CM | POA: Diagnosis not present

## 2022-08-21 DIAGNOSIS — D123 Benign neoplasm of transverse colon: Secondary | ICD-10-CM | POA: Diagnosis not present

## 2022-08-21 DIAGNOSIS — K635 Polyp of colon: Secondary | ICD-10-CM | POA: Diagnosis not present

## 2022-08-21 DIAGNOSIS — Z1211 Encounter for screening for malignant neoplasm of colon: Secondary | ICD-10-CM | POA: Diagnosis present

## 2022-08-21 MED ORDER — SODIUM CHLORIDE 0.9 % IV SOLN: 500.0000 mL | Freq: Once | INTRAVENOUS | Status: DC

## 2022-08-21 NOTE — Patient Instructions (Signed)
Please read handouts provided. Continue present medications. Await pathology results.   YOU HAD AN ENDOSCOPIC PROCEDURE TODAY AT THE [facility]:   Refer to the procedure report that was given to you for any specific questions about what was found during the examination.  If the procedure report does not answer your questions, please call your gastroenterologist to clarify.  If you requested that your care partner not be given the details of your procedure findings, then the procedure report has been included in a sealed envelope for you to review at your convenience later.  YOU SHOULD EXPECT: Some feelings of bloating in the abdomen. Passage of more gas than usual.  Walking can help get rid of the air that was put into your GI tract during the procedure and reduce the bloating. If you had a lower endoscopy (such as a colonoscopy or flexible sigmoidoscopy) you may notice spotting of blood in your stool or on the toilet paper. If you underwent a bowel prep for your procedure, you may not have a normal bowel movement for a few days.  Please Note:  You might notice some irritation and congestion in your nose or some drainage.  This is from the oxygen used during your procedure.  There is no need for concern and it should clear up in a day or so.  SYMPTOMS TO REPORT IMMEDIATELY:  Following lower endoscopy (colonoscopy or flexible sigmoidoscopy):  Excessive amounts of blood in the stool  Significant tenderness or worsening of abdominal pains  Swelling of the abdomen that is new, acute  Fever of 100F or higher  For urgent or emergent issues, a gastroenterologist can be reached at any hour by calling [phone]. Do not use MyChart messaging for urgent concerns.    DIET:  We do recommend a small meal at first, but then you may proceed to your regular diet.  Drink plenty of fluids but you should avoid alcoholic beverages for 24 hours.  ACTIVITY:  You should plan to take it easy for  the rest of today and you should NOT DRIVE or use heavy machinery until tomorrow (because of the sedation medicines used during the test).    FOLLOW UP: Our staff will call the number listed on your records the next business day following your procedure.  We will call around 7:15- 8:00 am to check on you and address any questions or concerns that you may have regarding the information given to you following your procedure. If we do not reach you, we will leave a message.     If any biopsies were taken you will be contacted by phone or by letter within the next 1-3 weeks.  Please call us at [phone] if you have not heard about the biopsies in 3 weeks.    SIGNATURES/CONFIDENTIALITY: You and/or your care partner have signed paperwork which will be entered into your electronic medical record.  These signatures attest to the fact that that the information above on your After Visit Summary has been reviewed and is understood.  Full responsibility of the confidentiality of this discharge information lies with you and/or your care-partner. 

## 2022-08-21 NOTE — Progress Notes (Signed)
Pt's states no medical or surgical changes since previsit or office visit. 

## 2022-08-21 NOTE — Progress Notes (Signed)
Called to room to assist during endoscopic procedure.  Patient ID and intended procedure confirmed with present staff. Received instructions for my participation in the procedure from the performing physician.  

## 2022-08-21 NOTE — Progress Notes (Signed)
Report given to PACU, vss 

## 2022-08-21 NOTE — Progress Notes (Signed)
GASTROENTEROLOGY PROCEDURE H&P NOTE   Primary Care Physician: Kirk Ruths, MD    Reason for Procedure:  Colon cancer screening  Plan:    Colonoscopy  Patient is appropriate for endoscopic procedure(s) in the ambulatory ([facility]) setting.  The nature of the procedure, as well as the risks, benefits, and alternatives were carefully and thoroughly reviewed with the patient. Ample time for discussion and questions allowed. The patient understood, was satisfied, and agreed to proceed.     HPI: Kyle Velasquez is a 61 y.o. male who presents for screening colonoscopy.  Medical history as below.  Tolerated the prep.  No recent chest pain or shortness of breath.  No abdominal pain today.  Past Medical History:  Diagnosis Date   Dyslipidemia 06/22/2012   Epiretinal membrane (ERM) of left eye 02/2019   Hyperlipidemia    Iron overload 07/26/2019   Seizures ([place])    LAST HAD SEIZURE IN 1981    Past Surgical History:  Procedure Laterality Date   COLONOSCOPY  08/2012   WNL, sm int hemorrhoids, rec rpt 10 yrs ([place])   EXCISION OF SKIN TAG N/A 09/09/2015   Procedure: EXCISION OF SKIN TAG/ANAL SKIN TAGS;  Surgeon: Robert Bellow, MD;  Location: [facility] ORS;  Service: General;  Laterality: N/A;   VASECTOMY  2010   Dr. Purvis Kilts [facility])    Prior to Admission medications   Medication Sig Start Date End Date Taking? Authorizing Provider  atorvastatin (LIPITOR) 40 MG tablet Take 1 tablet by mouth daily. 04/17/21  Yes [provider]  Cholecalciferol (VITAMIN D3 PO) Take 1 tablet by mouth daily.   Yes [provider]  Coenzyme Q10 (CO Q 10) 100 MG CAPS Take 2 tablets by mouth daily.   Yes [provider]  cyanocobalamin 2000 MCG tablet Take 5,000 mcg by mouth daily.   Yes [provider]  Omega-3 Fatty Acids (FISH OIL PO) Take by mouth.   Yes [provider]  Probiotic Product (PROBIOTIC-10 PO) Take 1 tablet by mouth daily.   Yes [provider]  Zinc Sulfate (ZINC 15 PO) Take 1 tablet by mouth daily.   Yes [provider]    Current Outpatient Medications  Medication Sig Dispense Refill   atorvastatin (LIPITOR) 40 MG tablet Take 1 tablet by mouth daily.     Cholecalciferol (VITAMIN D3 PO) Take 1 tablet by mouth daily.     Coenzyme Q10 (CO Q 10) 100 MG CAPS Take 2 tablets by mouth daily.     cyanocobalamin 2000 MCG tablet Take 5,000 mcg by mouth daily.     Omega-3 Fatty Acids (FISH OIL PO) Take by mouth.     Probiotic Product (PROBIOTIC-10 PO) Take 1 tablet by mouth daily.     Zinc Sulfate (ZINC 15 PO) Take 1 tablet by mouth daily.     Current Facility-Administered Medications  Medication Dose Route Frequency Provider Last Rate Last Admin   0.9 %  sodium chloride infusion  500 mL Intravenous Once Trustin Chapa, Lajuan Lines, MD        Allergies as of 08/21/2022   (No Known Allergies)    Family History  Problem Relation Age of Onset   Hypertension Mother    Hyperlipidemia Mother    Other Father        Hypoglycemia   HIV/AIDS Father        from blood transfusion    Diabetes Maternal Grandfather    Heart failure Maternal Grandmother    Diabetes Paternal  Grandfather    Diabetes Paternal Grandmother    Breast cancer Sister    Breast cancer Maternal Aunt    Coronary artery disease Paternal Uncle        MI   Prostate cancer Maternal Uncle    Breast cancer Paternal Aunt    Stroke Neg Hx    Colon cancer Neg Hx    Stomach cancer Neg Hx     Social History   Socioeconomic History   Marital status: Married    Spouse name: Not on file   Number of children: 2   Years of education: Not on file   Highest education level: Not on file  Occupational History   Occupation: Sales promotion account executive  Tobacco Use   Smoking status: Former    Packs/day: 1.00    Years: 5.00    Total pack years: 5.00    Types: Cigarettes    Quit date: 12/07/1986    Years since quitting: 35.7   Smokeless tobacco: Never  Vaping Use    Vaping Use: Never used  Substance and Sexual Activity   Alcohol use: Not Currently    Comment: Occasional   Drug use: No   Sexual activity: Not on file  Other Topics Concern   Not on file  Social History Narrative   Caffeine use/day: 2-3 cups coffee/day    Lives with wife, 2 daughters, 1 dog.    Occupation: Network engineer    Exercise - basketball weekly, walking 2 miles a day    Diet: some water, fruits/vegetables occasionally, red meat 1x/wk, fish rarely   Social Determinants of Radio broadcast assistant Strain: Not on file  Food Insecurity: Not on file  Transportation Needs: Not on file  Physical Activity: Not on file  Stress: Not on file  Social Connections: Not on file  Intimate Partner Violence: Not on file    Physical Exam: Vital signs in last 24 hours: '@BP'$  114/69   Pulse 77   Temp (!) 96 F (35.6 C) (Temporal)   Ht '5\' 5"'$  (1.651 m)   Wt 190 lb (86.2 kg)   SpO2 97%   BMI 31.62 kg/m  GEN: NAD EYE: Sclerae anicteric ENT: MMM CV: Non-tachycardic Pulm: CTA b/l GI: Soft, NT/ND NEURO:  Alert & Oriented x 3   Zenovia Jarred, MD [facility]  08/21/2022 8:25 AM

## 2022-08-21 NOTE — Op Note (Signed)
[place] Patient Name: Kyle Velasquez Procedure Date: 08/21/2022 8:33 AM MRN: [id number] Endoscopist: Jerene Bears , MD Age: 61 Referring MD:  Date of Birth: 09-27-1961 Gender: Male Account #: [id number] Procedure:                Colonoscopy Indications:              Screening for colorectal malignant neoplasm, Last                            colonoscopy: 2013 Medicines:                Monitored Anesthesia Care Procedure:                Pre-Anesthesia Assessment:                           - Prior to the procedure, a History and Physical                            was performed, and patient medications and                            allergies were reviewed. The patient's tolerance of                            previous anesthesia was also reviewed. The risks                            and benefits of the procedure and the sedation                            options and risks were discussed with the patient.                            All questions were answered, and informed consent                            was obtained. Prior Anticoagulants: The patient has                            taken no previous anticoagulant or antiplatelet                            agents. ASA Grade Assessment: III - A patient with                            severe systemic disease. After reviewing the risks                            and benefits, the patient was deemed in                            satisfactory condition to undergo the procedure.  After obtaining informed consent, the colonoscope                            was passed under direct vision. Throughout the                            procedure, the patient's blood pressure, pulse, and                            oxygen saturations were monitored continuously. The                            Olympus CF-HQ190L (Serial# [id number]) Colonoscope was                            introduced through the anus and advanced to  the                            cecum, identified by appendiceal orifice and                            ileocecal valve. The colonoscopy was performed                            without difficulty. The patient tolerated the                            procedure well. The quality of the bowel                            preparation was good. The ileocecal valve,                            appendiceal orifice, and rectum were photographed. Scope In: 8:36:40 AM Scope Out: 8:55:18 AM Scope Withdrawal Time: 0 hours 11 minutes 22 seconds  Total Procedure Duration: 0 hours 18 minutes 38 seconds  Findings:                 The digital rectal exam was normal.                           Two sessile polyps were found in the transverse                            colon. The polyps were 4 to 6 mm in size. These                            polyps were removed with a cold snare. Resection                            and retrieval were complete.                           A 4 mm polyp was found in the sigmoid colon.  The                            polyp was sessile. The polyp was removed with a                            cold snare. Resection and retrieval were complete.                           A few small-mouthed diverticula were found in the                            sigmoid colon.                           Internal hemorrhoids were found during                            retroflexion. The hemorrhoids were small. Complications:            No immediate complications. Estimated Blood Loss:     Estimated blood loss was minimal. Impression:               - Two 4 to 6 mm polyps in the transverse colon,                            removed with a cold snare. Resected and retrieved.                           - One 4 mm polyp in the sigmoid colon, removed with                            a cold snare. Resected and retrieved.                           - Diverticulosis in the sigmoid colon.                           -  Small internal hemorrhoids. Recommendation:           - Patient has a contact number available for                            emergencies. The signs and symptoms of potential                            delayed complications were discussed with the                            patient. Return to normal activities tomorrow.                            Written discharge instructions were provided to the                            patient.                           -  Resume previous diet.                           - Continue present medications.                           - Await pathology results.                           - Repeat colonoscopy is recommended. The                            colonoscopy date will be determined after pathology                            results from today's exam become available for                            review. Jerene Bears, MD 08/21/2022 8:58:12 AM This report has been signed electronically.

## 2022-08-24 ENCOUNTER — Telehealth: Payer: Self-pay

## 2022-08-24 NOTE — Telephone Encounter (Signed)
  Follow up Call-     08/21/2022    7:39 AM  Call back number  Post procedure Call Back phone  # [phone]  Permission to leave phone message Yes     Patient questions:  Do you have a fever, pain , or abdominal swelling? No. Pain Score  0 *  Have you tolerated food without any problems? Yes.    Have you been able to return to your normal activities? Yes.    Do you have any questions about your discharge instructions: Diet   No. Medications  No. Follow up visit  No.  Do you have questions or concerns about your Care? No.  Actions: * If pain score is 4 or above: No action needed, pain <4.

## 2022-08-25 ENCOUNTER — Encounter: Payer: Self-pay | Admitting: Internal Medicine

## 2023-02-03 DIAGNOSIS — H35372 Puckering of macula, left eye: Secondary | ICD-10-CM | POA: Diagnosis not present

## 2023-02-03 DIAGNOSIS — H04123 Dry eye syndrome of bilateral lacrimal glands: Secondary | ICD-10-CM | POA: Diagnosis not present

## 2023-02-03 DIAGNOSIS — H018 Other specified inflammations of eyelid: Secondary | ICD-10-CM | POA: Diagnosis not present

## 2023-02-23 DIAGNOSIS — Z125 Encounter for screening for malignant neoplasm of prostate: Secondary | ICD-10-CM | POA: Diagnosis not present

## 2023-02-23 DIAGNOSIS — Z0000 Encounter for general adult medical examination without abnormal findings: Secondary | ICD-10-CM | POA: Diagnosis not present

## 2023-02-23 DIAGNOSIS — R7303 Prediabetes: Secondary | ICD-10-CM | POA: Diagnosis not present

## 2023-02-23 DIAGNOSIS — E782 Mixed hyperlipidemia: Secondary | ICD-10-CM | POA: Diagnosis not present

## 2023-02-23 DIAGNOSIS — N1831 Chronic kidney disease, stage 3a: Secondary | ICD-10-CM | POA: Diagnosis not present

## 2023-03-02 DIAGNOSIS — Z0000 Encounter for general adult medical examination without abnormal findings: Secondary | ICD-10-CM | POA: Diagnosis not present

## 2023-03-02 DIAGNOSIS — R7303 Prediabetes: Secondary | ICD-10-CM | POA: Diagnosis not present

## 2023-03-02 DIAGNOSIS — M25561 Pain in right knee: Secondary | ICD-10-CM | POA: Diagnosis not present

## 2023-03-02 DIAGNOSIS — E782 Mixed hyperlipidemia: Secondary | ICD-10-CM | POA: Diagnosis not present

## 2023-03-02 DIAGNOSIS — N1831 Chronic kidney disease, stage 3a: Secondary | ICD-10-CM | POA: Diagnosis not present

## 2023-03-15 DIAGNOSIS — M25861 Other specified joint disorders, right knee: Secondary | ICD-10-CM | POA: Diagnosis not present

## 2023-03-15 DIAGNOSIS — G8929 Other chronic pain: Secondary | ICD-10-CM | POA: Diagnosis not present

## 2023-03-15 DIAGNOSIS — M25561 Pain in right knee: Secondary | ICD-10-CM | POA: Diagnosis not present

## 2023-05-19 ENCOUNTER — Other Ambulatory Visit: Payer: Commercial Managed Care - HMO

## 2023-05-21 ENCOUNTER — Ambulatory Visit: Payer: Commercial Managed Care - HMO | Admitting: Oncology

## 2023-05-25 ENCOUNTER — Other Ambulatory Visit: Payer: Self-pay

## 2023-05-25 DIAGNOSIS — Z148 Genetic carrier of other disease: Secondary | ICD-10-CM

## 2023-05-26 ENCOUNTER — Inpatient Hospital Stay: Payer: 59 | Attending: Oncology

## 2023-05-26 ENCOUNTER — Encounter: Payer: Self-pay | Admitting: Oncology

## 2023-05-26 DIAGNOSIS — Z148 Genetic carrier of other disease: Secondary | ICD-10-CM | POA: Diagnosis present

## 2023-05-26 LAB — IRON AND TIBC
Iron: 84 ug/dL (ref 45–182)
Saturation Ratios: 29 % (ref 17.9–39.5)
TIBC: 294 ug/dL (ref 250–450)
UIBC: 210 ug/dL

## 2023-05-26 LAB — CBC WITH DIFFERENTIAL (CANCER CENTER ONLY)
Abs Immature Granulocytes: 0.03 10*3/uL (ref 0.00–0.07)
Basophils Absolute: 0 10*3/uL (ref 0.0–0.1)
Basophils Relative: 1 %
Eosinophils Absolute: 0.2 10*3/uL (ref 0.0–0.5)
Eosinophils Relative: 3 %
HCT: 44.8 % (ref 39.0–52.0)
Hemoglobin: 14.8 g/dL (ref 13.0–17.0)
Immature Granulocytes: 1 %
Lymphocytes Relative: 39 %
Lymphs Abs: 1.9 10*3/uL (ref 0.7–4.0)
MCH: 30.1 pg (ref 26.0–34.0)
MCHC: 33 g/dL (ref 30.0–36.0)
MCV: 91.1 fL (ref 80.0–100.0)
Monocytes Absolute: 0.5 10*3/uL (ref 0.1–1.0)
Monocytes Relative: 10 %
Neutro Abs: 2.2 10*3/uL (ref 1.7–7.7)
Neutrophils Relative %: 46 %
Platelet Count: 160 10*3/uL (ref 150–400)
RBC: 4.92 MIL/uL (ref 4.22–5.81)
RDW: 13.2 % (ref 11.5–15.5)
WBC Count: 4.7 10*3/uL (ref 4.0–10.5)
nRBC: 0 % (ref 0.0–0.2)

## 2023-05-26 LAB — FERRITIN: Ferritin: 108 ng/mL (ref 24–336)

## 2023-05-28 ENCOUNTER — Inpatient Hospital Stay: Payer: 59 | Admitting: Oncology

## 2023-06-08 ENCOUNTER — Encounter: Payer: Self-pay | Admitting: Oncology

## 2023-06-08 ENCOUNTER — Inpatient Hospital Stay: Payer: 59 | Attending: Oncology | Admitting: Oncology

## 2023-06-08 VITALS — BP 129/82 | HR 80 | Temp 97.6°F | Ht 65.0 in | Wt 205.5 lb

## 2023-06-08 DIAGNOSIS — Z148 Genetic carrier of other disease: Secondary | ICD-10-CM

## 2023-06-08 NOTE — Progress Notes (Signed)
Hematology/Oncology Progress note [phone] [phone]     Patient Care Team: Lauro Regulus, MD as PCP - General (Internal Medicine) Lemar Livings Merrily Pew, MD as Consulting Physician (General Surgery) Eustaquio Boyden, MD as Referring Physician (Family Medicine)   Name of the patient: Kyle Velasquez  [id number]  [birth year]   REASON FOR VISIT  follow-up for hemochromatosis/iron overload management.   Assessment and Plan.   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 .  Orders Placed This Encounter  Procedures   CMP (Cancer Center only)    Standing Status:   Future    Standing Expiration Date:   06/07/2024   CBC with Differential (Cancer Center Only)    Standing Status:   Future    Standing Expiration Date:   06/07/2024   Iron and TIBC    Standing Status:   Future    Standing Expiration Date:   06/07/2024   Ferritin    Standing Status:   Future    Standing Expiration Date:   06/07/2024   Follow up in 6 months  Rickard Patience, MD, PhD [facility] Oncology 06/08/2023     PERTINENT HEMATOLOGY HISTORY Ferritin level was elevated at 628 on 02/22/2019. he denies any family history of hemochromatosis or anyone with condition needs frequent blood removal. Denies alcohol use 08/27/2019 colonoscopy done on  which showed normal colon and a small internal hemorrhoids. He is on Atorvastatin 40 mg  INTERVAL HISTORY Marlando Miyata is a 62 y.o. male who has above history reviewed by me today presents for follow up visit for management of heterozygous hemochromatosis He reports recent upper respiratory infection.  Symptoms are better.   Review of Systems  Constitutional:  Negative for appetite change, chills, fatigue, fever and  unexpected weight change.  HENT:   Negative for hearing loss and voice change.   Eyes:  Negative for eye problems and icterus.  Respiratory:  Positive for cough. Negative for chest tightness and shortness of breath.   Cardiovascular:  Negative for chest pain and leg swelling.  Gastrointestinal:  Negative for abdominal distention and abdominal pain.  Endocrine: Negative for hot flashes.  Genitourinary:  Negative for difficulty urinating, dysuria and frequency.   Musculoskeletal:  Negative for arthralgias.  Skin:  Negative for itching and rash.  Neurological:  Negative for light-headedness and numbness.  Hematological:  Negative for adenopathy. Does not bruise/bleed easily.  Psychiatric/Behavioral:  Negative for confusion.      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 Dalia Heading)    Social History   Socioeconomic History   Marital status: Married    Spouse name: Not on file   Number of children:  2   Years of education: Not on file   Highest education level: Not on file  Occupational History   Occupation: Nature conservation officer  Tobacco Use   Smoking status: Former    Packs/day: 1.00    Years: 5.00    Additional pack years: 0.00    Total pack years: 5.00    Types: Cigarettes    Quit date: 12/07/1986    Years since quitting: 36.5   Smokeless tobacco: Never  Vaping Use   Vaping Use: Never used  Substance and Sexual Activity   Alcohol use: Not Currently     Comment: Occasional   Drug use: No   Sexual activity: Not on file  Other Topics Concern   Not on file  Social History Narrative   Caffeine use/day: 2-3 cups coffee/day    Lives with wife, 2 daughters, 1 dog.    Occupation: Chemical engineer    Exercise - basketball weekly, walking 2 miles a day    Diet: some water, fruits/vegetables occasionally, red meat 1x/wk, fish rarely   Social Determinants of Corporate investment banker Strain: Not on file  Food Insecurity: Not on file  Transportation Needs: Not on file  Physical Activity: Not on file  Stress: Not on file  Social Connections: Not on file  Intimate Partner Violence: Not on file    Family History  Problem Relation Age of Onset   Hypertension Mother    Hyperlipidemia Mother    Other Father        Hypoglycemia   HIV/AIDS Father        from blood transfusion    Breast cancer Sister    Breast cancer Maternal Aunt    Prostate cancer Maternal Uncle    Breast cancer Paternal Aunt    Coronary artery disease Paternal Uncle        MI   Heart failure Maternal Grandmother    Diabetes Maternal Grandfather    Diabetes Paternal Grandmother    Diabetes Paternal Grandfather    Stroke Neg Hx    Colon cancer Neg Hx    Stomach cancer Neg Hx      Current Outpatient Medications:    atorvastatin (LIPITOR) 40 MG tablet, Take 1 tablet by mouth daily., Disp: , Rfl:    Cholecalciferol (VITAMIN D3 PO), Take 1 tablet by mouth daily., Disp: , Rfl:    Coenzyme Q10 (CO Q 10) 100 MG CAPS, Take 2 tablets by mouth daily., Disp: , Rfl:    cyanocobalamin 2000 MCG tablet, Take 5,000 mcg by mouth daily., Disp: , Rfl:    Omega-3 Fatty Acids (FISH OIL PO), Take by mouth., Disp: , Rfl:    Zinc Sulfate (ZINC 15 PO), Take 1 tablet by mouth daily., Disp: , Rfl:    Probiotic Product (PROBIOTIC-10 PO), Take 1 tablet by mouth daily., Disp: , Rfl:   Physical exam:  Vitals:   06/08/23 0954  BP: 129/82  Pulse: 80  Temp: 97.6 F (36.4 C)   TempSrc: Tympanic  SpO2: 99%  Weight: 205 lb 8 oz (93.2 kg)  Height: 5\' 5"  (1.651 m)   Physical Exam Constitutional:      General: He is not in acute distress. HENT:     Head: Normocephalic and atraumatic.  Eyes:     General: No scleral icterus.    Pupils: Pupils are equal, round, and reactive to light.  Cardiovascular:     Rate and Rhythm: Normal rate and regular rhythm.     Heart sounds:  Normal heart sounds.  Pulmonary:     Effort: Pulmonary effort is normal. No respiratory distress.     Breath sounds: No wheezing.  Abdominal:     General: Bowel sounds are normal. There is no distension.     Palpations: Abdomen is soft. There is no mass.     Tenderness: There is no abdominal tenderness.  Musculoskeletal:        General: No deformity. Normal range of motion.     Cervical back: Normal range of motion and neck supple.  Skin:    General: Skin is warm and dry.     Findings: No erythema or rash.  Neurological:     Mental Status: He is alert and oriented to person, place, and time.     Cranial Nerves: No cranial nerve deficit.     Coordination: Coordination normal.  Psychiatric:        Behavior: Behavior normal.        Thought Content: Thought content normal.        Latest Ref Rng & Units 05/13/2022    1:12 PM  CMP  Glucose 70 - 99 mg/dL 409   BUN 6 - 20 mg/dL 20   Creatinine 8.11 - 1.24 mg/dL 9.14   Sodium 782 - 956 mmol/L 137   Potassium 3.5 - 5.1 mmol/L 3.7   Chloride 98 - 111 mmol/L 106   CO2 22 - 32 mmol/L 24   Calcium 8.9 - 10.3 mg/dL 8.7   Total Protein 6.5 - 8.1 g/dL 7.1   Total Bilirubin 0.3 - 1.2 mg/dL 0.5   Alkaline Phos 38 - 126 U/L 68   AST 15 - 41 U/L 27   ALT 0 - 44 U/L 44       Latest Ref Rng & Units 05/26/2023    7:59 AM  CBC  WBC 4.0 - 10.5 K/uL 4.7   Hemoglobin 13.0 - 17.0 g/dL 21.3   Hematocrit 08.6 - 52.0 % 44.8   Platelets 150 - 400 K/uL 160     RADIOGRAPHIC STUDIES: I have personally reviewed the radiological images as listed and  agreed with the findings in the report. No results found.

## 2023-06-08 NOTE — Assessment & Plan Note (Addendum)
Heterozygous H63D mutation,  Labs are reviewed and discussed with patient Lab Results  Component Value Date   HGB 14.8 05/26/2023   TIBC 294 05/26/2023   IRONPCTSAT 29 05/26/2023   FERRITIN 108 05/26/2023   Recommend observation. Marland Kitchen

## 2023-06-08 NOTE — Progress Notes (Signed)
Can you listen to his breathing, he states he's been sick and coughing.

## 2024-01-25 ENCOUNTER — Encounter (INDEPENDENT_AMBULATORY_CARE_PROVIDER_SITE_OTHER): Payer: Self-pay

## 2024-05-31 ENCOUNTER — Encounter: Payer: Self-pay | Admitting: Oncology

## 2024-06-07 ENCOUNTER — Inpatient Hospital Stay: Payer: Commercial Managed Care - HMO | Attending: Oncology

## 2024-06-07 DIAGNOSIS — Z148 Genetic carrier of other disease: Secondary | ICD-10-CM

## 2024-06-07 LAB — CBC WITH DIFFERENTIAL (CANCER CENTER ONLY)
Abs Immature Granulocytes: 0.05 10*3/uL (ref 0.00–0.07)
Basophils Absolute: 0.1 10*3/uL (ref 0.0–0.1)
Basophils Relative: 1 %
Eosinophils Absolute: 0.2 10*3/uL (ref 0.0–0.5)
Eosinophils Relative: 4 %
HCT: 43.6 % (ref 39.0–52.0)
Hemoglobin: 14.5 g/dL (ref 13.0–17.0)
Immature Granulocytes: 1 %
Lymphocytes Relative: 39 %
Lymphs Abs: 2 10*3/uL (ref 0.7–4.0)
MCH: 30.5 pg (ref 26.0–34.0)
MCHC: 33.3 g/dL (ref 30.0–36.0)
MCV: 91.6 fL (ref 80.0–100.0)
Monocytes Absolute: 0.5 10*3/uL (ref 0.1–1.0)
Monocytes Relative: 11 %
Neutro Abs: 2.3 10*3/uL (ref 1.7–7.7)
Neutrophils Relative %: 44 %
Platelet Count: 165 10*3/uL (ref 150–400)
RBC: 4.76 MIL/uL (ref 4.22–5.81)
RDW: 13.3 % (ref 11.5–15.5)
WBC Count: 5.1 10*3/uL (ref 4.0–10.5)
nRBC: 0 % (ref 0.0–0.2)

## 2024-06-07 LAB — CMP (CANCER CENTER ONLY)
ALT: 31 U/L (ref 0–44)
AST: 26 U/L (ref 15–41)
Albumin: 3.8 g/dL (ref 3.5–5.0)
Alkaline Phosphatase: 55 U/L (ref 38–126)
Anion gap: 6 (ref 5–15)
BUN: 18 mg/dL (ref 8–23)
CO2: 24 mmol/L (ref 22–32)
Calcium: 9.1 mg/dL (ref 8.9–10.3)
Chloride: 107 mmol/L (ref 98–111)
Creatinine: 1.12 mg/dL (ref 0.61–1.24)
GFR, Estimated: >60 mL/min (ref >60)
Glucose, Bld: 85 mg/dL (ref 70–99)
Potassium: 4.2 mmol/L (ref 3.5–5.1)
Sodium: 137 mmol/L (ref 135–145)
Total Bilirubin: 0.8 mg/dL (ref 0.0–1.2)
Total Protein: 6.8 g/dL (ref 6.5–8.1)

## 2024-06-07 LAB — IRON AND TIBC
Iron: 102 ug/dL (ref 45–182)
Saturation Ratios: 34 % (ref 17.9–39.5)
TIBC: 302 ug/dL (ref 250–450)
UIBC: 200 ug/dL

## 2024-06-07 LAB — FERRITIN: Ferritin: 157 ng/mL (ref 24–336)

## 2024-06-12 ENCOUNTER — Inpatient Hospital Stay: Payer: Commercial Managed Care - HMO | Admitting: Oncology

## 2024-06-12 ENCOUNTER — Encounter: Payer: Self-pay | Admitting: Oncology

## 2024-06-12 VITALS — BP 125/84 | HR 66 | Temp 96.1°F | Resp 18 | Wt 196.3 lb

## 2024-06-12 DIAGNOSIS — Z148 Genetic carrier of other disease: Secondary | ICD-10-CM

## 2024-06-12 NOTE — Assessment & Plan Note (Addendum)
 Heterozygous H63D mutation,  Labs are reviewed and discussed with patient Lab Results  Component Value Date   HGB 14.5 06/07/2024   TIBC 302 06/07/2024   IRONPCTSAT 34 06/07/2024   FERRITIN 157 06/07/2024   No need for therapeutics phlebotomy Recommend observation.

## 2024-06-12 NOTE — Progress Notes (Signed)
 Hematology/Oncology Progress note [phone] [phone]     Patient Care Team: Lenon Layman ORN, MD as PCP - General (Internal Medicine) Dessa Reyes ORN, MD as Consulting Physician (General Surgery) Rilla Baller, MD as Referring Physician (Family Medicine) Babara Call, MD as Consulting Physician (Hematology and Oncology)   Name of the patient: Kyle Velasquez  [id number]  [birth year]   REASON FOR VISIT  follow-up for hemochromatosis/iron overload management.   Assessment and Plan.   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   Orders Placed This Encounter  Procedures   CBC with Differential (Cancer Center Only)    Standing Status:   Future    Expected Date:   06/12/2025    Expiration Date:   06/12/2025   CMP (Cancer Center only)    Standing Status:   Future    Expected Date:   06/12/2025    Expiration Date:   06/12/2025   Iron and TIBC    Standing Status:   Future    Expected Date:   06/12/2025    Expiration Date:   06/12/2025   Ferritin    Standing Status:   Future    Expected Date:   06/12/2025    Expiration Date:   06/12/2025   Follow up in 6 months  Call Babara, MD, PhD [facility] Hematology Oncology 06/12/2024     PERTINENT HEMATOLOGY HISTORY Ferritin level was elevated at 628 on 02/22/2019. he denies any family history of hemochromatosis or anyone with condition needs frequent blood removal. Denies alcohol use 08/27/2019 colonoscopy done on  which showed normal colon and a small internal hemorrhoids. He is on Atorvastatin 40 mg  INTERVAL HISTORY Kyle Velasquez is a 63 y.o. male who has above history reviewed by me today presents for follow up visit for management of heterozygous  hemochromatosis No new complaints.    Review of Systems  Constitutional:  Negative for appetite change, chills, fatigue, fever and unexpected weight change.  HENT:   Negative for hearing loss and voice change.   Eyes:  Negative for eye problems and icterus.  Respiratory:  Negative for chest tightness, cough and shortness of breath.   Cardiovascular:  Negative for chest pain and leg swelling.  Gastrointestinal:  Negative for abdominal distention and abdominal pain.  Endocrine: Negative for hot flashes.  Genitourinary:  Negative for difficulty urinating, dysuria and frequency.   Musculoskeletal:  Negative for arthralgias.  Skin:  Negative for itching and rash.  Neurological:  Negative for light-headedness and numbness.  Hematological:  Negative for adenopathy. Does not bruise/bleed easily.  Psychiatric/Behavioral:  Negative for confusion.      No Known Allergies   Past Medical History:  Diagnosis Date   Dyslipidemia 06/22/2012   Epiretinal membrane (ERM) of left eye 02/2019   Hyperlipidemia    Iron overload 07/26/2019   Seizures (HCC)    LAST HAD SEIZURE IN 1981     Past Surgical History:  Procedure Laterality Date   COLONOSCOPY  08/2012   WNL, sm int hemorrhoids, rec rpt 10 yrs (Pyrtle)   EXCISION OF SKIN TAG N/A 09/09/2015   Procedure: EXCISION OF SKIN TAG/ANAL SKIN TAGS;  Surgeon: Reyes ORN Dessa, MD;  Location: [facility] ORS;  Service: General;  Laterality: N/A;   VASECTOMY  2010   Dr. [facility])    Social History   Socioeconomic History   Marital status: Married    Spouse name: Not on file   Number of children: 2   Years of education: Not on file   Highest education level: Not on file  Occupational History   Occupation: Nature conservation officer  Tobacco Use   Smoking status: Former    Current packs/day: 0.00    Average packs/day: 1 pack/day for 5.0 years (5.0 ttl pk-yrs)    Types: Cigarettes    Start date: 12/07/1981    Quit date: 12/07/1986    Years since quitting:  37.5   Smokeless tobacco: Never  Vaping Use   Vaping status: Never Used  Substance and Sexual Activity   Alcohol use: Not Currently    Comment: Occasional   Drug use: No   Sexual activity: Not on file  Other Topics Concern   Not on file  Social History Narrative   Caffeine use/day: 2-3 cups coffee/day    Lives with wife, 2 daughters, 1 dog.    Occupation: Chemical engineer    Exercise - basketball weekly, walking 2 miles a day    Diet: some water, fruits/vegetables occasionally, red meat 1x/wk, fish rarely   Social Drivers of Corporate investment banker Strain: Low Risk  (03/02/2024)   Received from [facility]   Overall Financial Resource Strain (CARDIA)    Difficulty of Paying Living Expenses: Not hard at all  Food Insecurity: No Food Insecurity (03/02/2024)   Received from [facility]   Hunger Vital Sign    Within the past 12 months, you worried that your food would run out before you got the money to buy more.: Never true    Within the past 12 months, the food you bought just didn't last and you didn't have money to get more.: Never true  Transportation Needs: No Transportation Needs (03/02/2024)   Received from [facility] - Transportation    In the past 12 months, has lack of transportation kept you from medical appointments or from getting medications?: No    Lack of Transportation (Non-Medical): No  Physical Activity: Not on file  Stress: Not on file  Social Connections: Not on file  Intimate Partner Violence: Not on file    Family History  Problem Relation Age of Onset   Hypertension Mother    Hyperlipidemia Mother    Other Father        Hypoglycemia   HIV/AIDS Father        from blood transfusion    Breast cancer Sister    Breast cancer Maternal Aunt    Prostate cancer Maternal Uncle    Breast cancer Paternal Aunt    Coronary artery disease Paternal Uncle        MI   Heart failure  Maternal Grandmother    Diabetes Maternal Grandfather    Diabetes Paternal Grandmother    Diabetes Paternal Grandfather    Stroke Neg Hx    Colon cancer Neg Hx    Stomach cancer Neg Hx      Current Outpatient Medications:    Cholecalciferol (VITAMIN D3 PO), Take 1 tablet by mouth daily., Disp: , Rfl:    cyanocobalamin 2000 MCG tablet, Take 5,000 mcg by mouth daily., Disp: , Rfl:    Omega-3 Fatty Acids (FISH OIL PO), Take by mouth., Disp: , Rfl:    Probiotic Product (PROBIOTIC-10 PO), Take 1  tablet by mouth daily., Disp: , Rfl:    Zinc Sulfate (ZINC 15 PO), Take 1 tablet by mouth daily., Disp: , Rfl:   Physical exam:  Vitals:   06/12/24 0936  BP: 125/84  Pulse: 66  Resp: 18  Temp: (!) 96.1 F (35.6 C)  TempSrc: Tympanic  SpO2: 99%  Weight: 196 lb 4.8 oz (89 kg)   Physical Exam Constitutional:      General: He is not in acute distress. HENT:     Head: Normocephalic and atraumatic.  Eyes:     General: No scleral icterus.    Pupils: Pupils are equal, round, and reactive to light.  Cardiovascular:     Rate and Rhythm: Normal rate and regular rhythm.     Heart sounds: Normal heart sounds.  Pulmonary:     Effort: Pulmonary effort is normal. No respiratory distress.     Breath sounds: No wheezing.  Abdominal:     General: Bowel sounds are normal. There is no distension.     Palpations: Abdomen is soft. There is no mass.     Tenderness: There is no abdominal tenderness.  Musculoskeletal:        General: No deformity. Normal range of motion.     Cervical back: Normal range of motion and neck supple.  Skin:    General: Skin is warm and dry.     Findings: No erythema or rash.  Neurological:     Mental Status: He is alert and oriented to person, place, and time.     Cranial Nerves: No cranial nerve deficit.     Coordination: Coordination normal.  Psychiatric:        Behavior: Behavior normal.        Thought Content: Thought content normal.        Latest Ref Rng &  Units 06/07/2024    8:02 AM  CMP  Glucose 70 - 99 mg/dL 85   BUN 8 - 23 mg/dL 18   Creatinine 9.38 - 1.24 mg/dL 8.87   Sodium 864 - 854 mmol/L 137   Potassium 3.5 - 5.1 mmol/L 4.2   Chloride 98 - 111 mmol/L 107   CO2 22 - 32 mmol/L 24   Calcium 8.9 - 10.3 mg/dL 9.1   Total Protein 6.5 - 8.1 g/dL 6.8   Total Bilirubin 0.0 - 1.2 mg/dL 0.8   Alkaline Phos 38 - 126 U/L 55   AST 15 - 41 U/L 26   ALT 0 - 44 U/L 31       Latest Ref Rng & Units 06/07/2024    8:02 AM  CBC  WBC 4.0 - 10.5 K/uL 5.1   Hemoglobin 13.0 - 17.0 g/dL 85.4   Hematocrit 60.9 - 52.0 % 43.6   Platelets 150 - 400 K/uL 165     RADIOGRAPHIC STUDIES: I have personally reviewed the radiological images as listed and agreed with the findings in the report. No results found.

## 2025-06-07 ENCOUNTER — Other Ambulatory Visit

## 2025-06-12 ENCOUNTER — Ambulatory Visit: Admitting: Oncology
